# Patient Record
Sex: Male | Born: 1940 | Hispanic: No | Smoking: Never smoker
Health system: Southern US, Community
[De-identification: ages and names within clinical notes are randomized; demographics above are authoritative.]

## PROBLEM LIST (undated history)

## (undated) DIAGNOSIS — F32A Depression, unspecified: Secondary | ICD-10-CM

## (undated) DIAGNOSIS — R7303 Prediabetes: Secondary | ICD-10-CM

## (undated) DIAGNOSIS — F329 Major depressive disorder, single episode, unspecified: Secondary | ICD-10-CM

## (undated) DIAGNOSIS — Z974 Presence of external hearing-aid: Secondary | ICD-10-CM

## (undated) DIAGNOSIS — Z972 Presence of dental prosthetic device (complete) (partial): Secondary | ICD-10-CM

## (undated) DIAGNOSIS — I10 Essential (primary) hypertension: Secondary | ICD-10-CM

## (undated) DIAGNOSIS — E785 Hyperlipidemia, unspecified: Secondary | ICD-10-CM

## (undated) DIAGNOSIS — M1990 Unspecified osteoarthritis, unspecified site: Secondary | ICD-10-CM

## (undated) DIAGNOSIS — F419 Anxiety disorder, unspecified: Secondary | ICD-10-CM

## (undated) DIAGNOSIS — E039 Hypothyroidism, unspecified: Secondary | ICD-10-CM

## (undated) DIAGNOSIS — E079 Disorder of thyroid, unspecified: Secondary | ICD-10-CM

## (undated) DIAGNOSIS — C801 Malignant (primary) neoplasm, unspecified: Secondary | ICD-10-CM

## (undated) DIAGNOSIS — E119 Type 2 diabetes mellitus without complications: Secondary | ICD-10-CM

## (undated) HISTORY — DX: Depression, unspecified: F32.A

## (undated) HISTORY — DX: Hyperlipidemia, unspecified: E78.5

## (undated) HISTORY — PX: KIDNEY SURGERY: SHX687

## (undated) HISTORY — PX: FRACTURE SURGERY: SHX138

## (undated) HISTORY — DX: Type 2 diabetes mellitus without complications: E11.9

## (undated) HISTORY — DX: Essential (primary) hypertension: I10

## (undated) HISTORY — DX: Disorder of thyroid, unspecified: E07.9

## (undated) HISTORY — PX: KNEE SURGERY: SHX244

## (undated) HISTORY — DX: Malignant (primary) neoplasm, unspecified: C80.1

---
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
## 1998-12-24 ENCOUNTER — Emergency Department (HOSPITAL_COMMUNITY): Admission: EM | Admit: 1998-12-24 | Discharge: 1998-12-24 | Payer: Self-pay | Admitting: *Deleted

## 2000-08-15 ENCOUNTER — Emergency Department (HOSPITAL_COMMUNITY): Admission: EM | Admit: 2000-08-15 | Discharge: 2000-08-15 | Payer: Self-pay | Admitting: Emergency Medicine

## 2005-03-27 ENCOUNTER — Emergency Department (HOSPITAL_COMMUNITY): Admission: EM | Admit: 2005-03-27 | Discharge: 2005-03-27 | Payer: Self-pay | Admitting: *Deleted

## 2005-04-03 ENCOUNTER — Emergency Department (HOSPITAL_COMMUNITY): Admission: EM | Admit: 2005-04-03 | Discharge: 2005-04-03 | Payer: Self-pay | Admitting: Emergency Medicine

## 2005-04-07 ENCOUNTER — Emergency Department (HOSPITAL_COMMUNITY): Admission: EM | Admit: 2005-04-07 | Discharge: 2005-04-07 | Payer: Self-pay | Admitting: Emergency Medicine

## 2008-07-03 DIAGNOSIS — C61 Malignant neoplasm of prostate: Secondary | ICD-10-CM

## 2008-07-03 HISTORY — DX: Malignant neoplasm of prostate: C61

## 2008-07-03 HISTORY — PX: PROSTATECTOMY: SHX69

## 2011-06-14 ENCOUNTER — Encounter: Payer: Self-pay | Admitting: Emergency Medicine

## 2011-06-14 ENCOUNTER — Emergency Department (HOSPITAL_COMMUNITY)
Admission: EM | Admit: 2011-06-14 | Discharge: 2011-06-14 | Disposition: A | Discharge status: Home / Self Care | Payer: Medicare Other | Attending: Emergency Medicine | Admitting: Emergency Medicine

## 2011-06-14 DIAGNOSIS — M25562 Pain in left knee: Secondary | ICD-10-CM

## 2011-06-14 DIAGNOSIS — F17200 Nicotine dependence, unspecified, uncomplicated: Secondary | ICD-10-CM | POA: Insufficient documentation

## 2011-06-14 DIAGNOSIS — M25569 Pain in unspecified knee: Secondary | ICD-10-CM | POA: Insufficient documentation

## 2011-06-14 HISTORY — DX: Anxiety disorder, unspecified: F41.9

## 2011-06-14 MED ORDER — HYDROCODONE-ACETAMINOPHEN 5-325 MG PO TABS: 1.0000 | ORAL_TABLET | ORAL | Status: AC | PRN

## 2011-06-14 NOTE — ED Provider Notes (Signed)
Medical screening examination/treatment/procedure(s) were performed by non-physician practitioner and as supervising physician I was immediately available for consultation/collaboration.   Glynn Octave, MD 06/14/11 [phone]

## 2011-06-14 NOTE — ED Notes (Signed)
Left knee pain x several days no new injury recently, has hx of surgery on that knee hurts to walk

## 2011-06-14 NOTE — ED Provider Notes (Signed)
History     CSN: [id number] Arrival date & time: 06/14/2011  8:30 AM   First MD Initiated Contact with Patient 06/14/11 [phone]      Chief Complaint  Patient presents with  . Knee Pain    (Consider location/radiation/quality/duration/timing/severity/associated sxs/prior treatment) HPI History provided by pt.   Pt c/o non-traumatic L knee pain for the past week.  Pain worst when he first wakes in am and is aggravated by bearing weight.  No associated fever, weakness, paresthesias.  H/o surgery on this knee.  No known arthritis.    Past Medical History  Diagnosis Date  . Anxiety     Past Surgical History  Procedure Date  . Fracture surgery     No family history on file.  History  Substance Use Topics  . Smoking status: Current Everyday Smoker  . Smokeless tobacco: Not on file  . Alcohol Use: No      Review of Systems  All other systems reviewed and are negative.    Allergies  Review of patient's allergies indicates no known allergies.  Home Medications   Current Outpatient Rx  Name Route Sig Dispense Refill  . CILOSTAZOL 50 MG PO TABS Oral Take 50 mg by mouth 2 (two) times daily.      Marland Kitchen GABAPENTIN 100 MG PO CAPS Oral Take 100 mg by mouth 2 (two) times daily.      Marland Kitchen MIRTAZAPINE 15 MG PO TABS Oral Take 15 mg by mouth 2 (two) times daily.      Marland Kitchen PRAVASTATIN SODIUM 10 MG PO TABS Oral Take 10 mg by mouth 2 (two) times daily.        BP 136/73  Pulse 63  Temp(Src) 97.3 F (36.3 C) (Oral)  Resp 16  SpO2 99%  Physical Exam  Nursing note and vitals reviewed. Constitutional: He is oriented to person, place, and time. He appears well-developed and well-nourished. No distress.  HENT:  Head: Normocephalic and atraumatic.  Eyes:       Normal appearance  Neck: Normal range of motion.  Musculoskeletal:       Left knee w/out deformity, edema, erythema or other skin changes.  Non-tender.  Mild pain w/ extreme passive flexion only.  Distal NV intact.   Neurological:  He is alert and oriented to person, place, and time.  Psychiatric: He has a normal mood and affect. His behavior is normal.    ED Course  Procedures (including critical care time)  Labs Reviewed - No data to display No results found.   1. Pain in left knee       MDM  Pt presents w/ non-traumatic L knee pain.  No findings to suggest infection on exam.  Has h/o surgery of this joint and I suspect arthritis.  Ortho tech placed in knee sleeve and pt discharged home w/ pain medication and referral to ortho.  Return precautions discussed.         Otilio Miu, PA 06/14/11 1726

## 2014-10-01 ENCOUNTER — Encounter (HOSPITAL_COMMUNITY): Payer: Self-pay | Admitting: Emergency Medicine

## 2014-10-01 ENCOUNTER — Emergency Department (HOSPITAL_COMMUNITY)
Admission: EM | Admit: 2014-10-01 | Discharge: 2014-10-01 | Disposition: A | Discharge status: Home / Self Care | Payer: Medicare Other | Attending: Emergency Medicine | Admitting: Emergency Medicine

## 2014-10-01 DIAGNOSIS — Y9389 Activity, other specified: Secondary | ICD-10-CM | POA: Diagnosis not present

## 2014-10-01 DIAGNOSIS — F419 Anxiety disorder, unspecified: Secondary | ICD-10-CM | POA: Insufficient documentation

## 2014-10-01 DIAGNOSIS — Y998 Other external cause status: Secondary | ICD-10-CM | POA: Diagnosis not present

## 2014-10-01 DIAGNOSIS — S61412A Laceration without foreign body of left hand, initial encounter: Secondary | ICD-10-CM | POA: Insufficient documentation

## 2014-10-01 DIAGNOSIS — W260XXA Contact with knife, initial encounter: Secondary | ICD-10-CM | POA: Insufficient documentation

## 2014-10-01 DIAGNOSIS — Y9289 Other specified places as the place of occurrence of the external cause: Secondary | ICD-10-CM | POA: Diagnosis not present

## 2014-10-01 DIAGNOSIS — Z79899 Other long term (current) drug therapy: Secondary | ICD-10-CM | POA: Diagnosis not present

## 2014-10-01 NOTE — ED Provider Notes (Addendum)
CSN: [id number]     Arrival date & time 10/01/14  1051 History  This chart was scribed for non-physician practitioner, Alvina Chou, PA-C, working with Noemi Chapel, MD, by Chester Holstein, ED Scribe. This patient was seen in room TR05C/TR05C and the patient's care was started at 11:37 AM.    Chief Complaint  Patient presents with  . Extremity Laceration    Patient is a 74 y.o. male presenting with skin laceration. The history is provided by the patient. No language interpreter was used.  Laceration Location:  Hand Hand laceration location:  L hand Length (cm):  3cm Depth:  Cutaneous Quality: straight   Bleeding: controlled   Time since incident:  1 day Laceration mechanism:  Knife Pain details:    Quality:  Unable to specify   Severity:  No pain   Timing:  Rare   Progression:  Resolved Foreign body present:  No foreign bodies Relieved by:  Nothing Worsened by:  Nothing tried Ineffective treatments:  Pressure Tetanus status:  Up to date  HPI Comments: Ricky Young is a 74 y.o. male who presents to the Emergency Department complaining of laceration to left hand with onset yesterday around 11 AM. Pt states he cut his hand with a roofing knife. No active bleeding. Pt denies any other symptoms.   Past Medical History  Diagnosis Date  . Anxiety    Past Surgical History  Procedure Laterality Date  . Fracture surgery     No family history on file. History  Substance Use Topics  . Smoking status: Never Smoker   . Smokeless tobacco: Current User    Types: Chew  . Alcohol Use: No    Review of Systems  Skin: Positive for wound.  All other systems reviewed and are negative.     Allergies  Review of patient's allergies indicates no known allergies.  Home Medications   Prior to Admission medications   Medication Sig Start Date End Date Taking? Authorizing Provider  cilostazol (PLETAL) 50 MG tablet Take 50 mg by mouth 2 (two) times daily.      Historical Provider,  MD  gabapentin (NEURONTIN) 100 MG capsule Take 100 mg by mouth 2 (two) times daily.      Historical Provider, MD  mirtazapine (REMERON) 15 MG tablet Take 15 mg by mouth 2 (two) times daily.      Historical Provider, MD  pravastatin (PRAVACHOL) 10 MG tablet Take 10 mg by mouth 2 (two) times daily.      Historical Provider, MD   BP 121/62 mmHg  Pulse 62  Temp(Src) 98 F (36.7 C) (Oral)  Resp 20  Ht 5\' 6"  (1.676 m)  Wt 140 lb (63.504 kg)  BMI 22.61 kg/m2  SpO2 99% Physical Exam  Constitutional: He is oriented to person, place, and time. He appears well-developed and well-nourished. No distress.  HENT:  Head: Normocephalic and atraumatic.  Eyes: Conjunctivae are normal.  Neck: Normal range of motion. Neck supple.  Cardiovascular: Normal rate and regular rhythm.  Exam reveals no gallop and no friction rub.   No murmur heard. Pulmonary/Chest: Effort normal and breath sounds normal. He has no wheezes. He has no rales. He exhibits no tenderness.  Abdominal: Soft. There is no tenderness.  Musculoskeletal: Normal range of motion.  Neurological: He is alert and oriented to person, place, and time.  Speech is goal-oriented. Moves limbs without ataxia.   Skin: Skin is warm and dry.  3cm laceration of hypothenar aspect of left hand. No bleeding.  Psychiatric: He has a normal mood and affect. His behavior is normal.  Nursing note and vitals reviewed.   ED Course  Procedures (including critical care time) DIAGNOSTIC STUDIES: Oxygen Saturation is 99% on room, normal by my interpretation.    COORDINATION OF CARE: 11:39 AM Discussed treatment plan with patient at beside, the patient agrees with the plan and has no further questions at this time.   LACERATION REPAIR Performed by: Alvina Chou Authorized by: Alvina Chou Consent: Verbal consent obtained. Risks and benefits: risks, benefits and alternatives were discussed Consent given by: patient Patient identity confirmed:  provided demographic data Prepped and Draped in normal sterile fashion Wound explored  Laceration Location: hypothenar aspect of left hand  Laceration Length: 3 cm  No Foreign Bodies seen or palpated  Anesthesia: none  Irrigation method: syringe Amount of cleaning: standard  Skin closure: dermabond  Number of sutures: n/a  Technique: n/a  Patient tolerance: Patient tolerated the procedure well with no immediate complications.   Labs Review Labs Reviewed - No data to display  Imaging Review No results found.   EKG Interpretation None      MDM   Final diagnoses:  Hand laceration, left, initial encounter   12:06 PM Laceration cleaned and repaired without difficulty. Tetanus UTD. No other injuries.   I personally performed the services described in this documentation, which was scribed in my presence. The recorded information has been reviewed and is accurate.     Alvina Chou, PA-C 10/01/14 [place], MD 10/01/14 [place], PA-C 10/11/14 0423  Noemi Chapel, MD 10/11/14 [phone]

## 2014-10-01 NOTE — Discharge Instructions (Signed)
Keep wound area clean. The skin glue will dissolve as the wound heals. Refer to attached documents for more information.

## 2014-10-01 NOTE — ED Notes (Signed)
Patient states he was working outside yesterday around 1100.  He cut his hand with roofing knife.   Patient denies other symptoms.

## 2016-10-05 DIAGNOSIS — M170 Bilateral primary osteoarthritis of knee: Secondary | ICD-10-CM | POA: Diagnosis not present

## 2016-10-05 DIAGNOSIS — M25561 Pain in right knee: Secondary | ICD-10-CM | POA: Diagnosis not present

## 2016-10-05 DIAGNOSIS — M1712 Unilateral primary osteoarthritis, left knee: Secondary | ICD-10-CM | POA: Diagnosis not present

## 2016-10-05 DIAGNOSIS — M25562 Pain in left knee: Secondary | ICD-10-CM | POA: Diagnosis not present

## 2016-10-11 DIAGNOSIS — M1712 Unilateral primary osteoarthritis, left knee: Secondary | ICD-10-CM | POA: Diagnosis not present

## 2016-10-11 DIAGNOSIS — M25562 Pain in left knee: Secondary | ICD-10-CM | POA: Diagnosis not present

## 2016-10-25 DIAGNOSIS — M25562 Pain in left knee: Secondary | ICD-10-CM | POA: Diagnosis not present

## 2016-10-25 DIAGNOSIS — M1712 Unilateral primary osteoarthritis, left knee: Secondary | ICD-10-CM | POA: Diagnosis not present

## 2016-11-02 DIAGNOSIS — M25562 Pain in left knee: Secondary | ICD-10-CM | POA: Diagnosis not present

## 2016-11-02 DIAGNOSIS — M1712 Unilateral primary osteoarthritis, left knee: Secondary | ICD-10-CM | POA: Diagnosis not present

## 2016-11-07 DIAGNOSIS — M25562 Pain in left knee: Secondary | ICD-10-CM | POA: Diagnosis not present

## 2016-11-07 DIAGNOSIS — M1712 Unilateral primary osteoarthritis, left knee: Secondary | ICD-10-CM | POA: Diagnosis not present

## 2016-11-09 DIAGNOSIS — E538 Deficiency of other specified B group vitamins: Secondary | ICD-10-CM | POA: Diagnosis not present

## 2016-11-09 DIAGNOSIS — D649 Anemia, unspecified: Secondary | ICD-10-CM | POA: Diagnosis not present

## 2016-11-09 DIAGNOSIS — N182 Chronic kidney disease, stage 2 (mild): Secondary | ICD-10-CM | POA: Diagnosis not present

## 2016-11-09 DIAGNOSIS — I1310 Hypertensive heart and chronic kidney disease without heart failure, with stage 1 through stage 4 chronic kidney disease, or unspecified chronic kidney disease: Secondary | ICD-10-CM | POA: Diagnosis not present

## 2016-11-09 DIAGNOSIS — R972 Elevated prostate specific antigen [PSA]: Secondary | ICD-10-CM | POA: Diagnosis not present

## 2016-11-09 DIAGNOSIS — E559 Vitamin D deficiency, unspecified: Secondary | ICD-10-CM | POA: Diagnosis not present

## 2016-11-09 DIAGNOSIS — E785 Hyperlipidemia, unspecified: Secondary | ICD-10-CM | POA: Diagnosis not present

## 2016-11-16 DIAGNOSIS — E559 Vitamin D deficiency, unspecified: Secondary | ICD-10-CM | POA: Diagnosis not present

## 2016-11-16 DIAGNOSIS — E785 Hyperlipidemia, unspecified: Secondary | ICD-10-CM | POA: Diagnosis not present

## 2016-11-16 DIAGNOSIS — E538 Deficiency of other specified B group vitamins: Secondary | ICD-10-CM | POA: Diagnosis not present

## 2016-11-16 DIAGNOSIS — R635 Abnormal weight gain: Secondary | ICD-10-CM | POA: Diagnosis not present

## 2016-11-16 DIAGNOSIS — I739 Peripheral vascular disease, unspecified: Secondary | ICD-10-CM | POA: Diagnosis not present

## 2016-12-14 DIAGNOSIS — R635 Abnormal weight gain: Secondary | ICD-10-CM | POA: Diagnosis not present

## 2016-12-14 DIAGNOSIS — E039 Hypothyroidism, unspecified: Secondary | ICD-10-CM | POA: Diagnosis not present

## 2017-02-16 DIAGNOSIS — E039 Hypothyroidism, unspecified: Secondary | ICD-10-CM | POA: Diagnosis not present

## 2017-02-16 DIAGNOSIS — E785 Hyperlipidemia, unspecified: Secondary | ICD-10-CM | POA: Diagnosis not present

## 2017-02-16 DIAGNOSIS — E559 Vitamin D deficiency, unspecified: Secondary | ICD-10-CM | POA: Diagnosis not present

## 2017-02-16 DIAGNOSIS — J449 Chronic obstructive pulmonary disease, unspecified: Secondary | ICD-10-CM | POA: Diagnosis not present

## 2017-02-16 DIAGNOSIS — Z79899 Other long term (current) drug therapy: Secondary | ICD-10-CM | POA: Diagnosis not present

## 2017-02-16 DIAGNOSIS — I1310 Hypertensive heart and chronic kidney disease without heart failure, with stage 1 through stage 4 chronic kidney disease, or unspecified chronic kidney disease: Secondary | ICD-10-CM | POA: Diagnosis not present

## 2017-02-16 DIAGNOSIS — N182 Chronic kidney disease, stage 2 (mild): Secondary | ICD-10-CM | POA: Diagnosis not present

## 2017-02-16 DIAGNOSIS — E538 Deficiency of other specified B group vitamins: Secondary | ICD-10-CM | POA: Diagnosis not present

## 2017-04-16 DIAGNOSIS — E785 Hyperlipidemia, unspecified: Secondary | ICD-10-CM | POA: Diagnosis not present

## 2017-04-16 DIAGNOSIS — N182 Chronic kidney disease, stage 2 (mild): Secondary | ICD-10-CM | POA: Diagnosis not present

## 2017-04-16 DIAGNOSIS — I10 Essential (primary) hypertension: Secondary | ICD-10-CM | POA: Diagnosis not present

## 2017-04-16 DIAGNOSIS — J449 Chronic obstructive pulmonary disease, unspecified: Secondary | ICD-10-CM | POA: Diagnosis not present

## 2017-04-16 DIAGNOSIS — Z23 Encounter for immunization: Secondary | ICD-10-CM | POA: Diagnosis not present

## 2017-06-18 DIAGNOSIS — N182 Chronic kidney disease, stage 2 (mild): Secondary | ICD-10-CM | POA: Diagnosis not present

## 2017-06-18 DIAGNOSIS — D509 Iron deficiency anemia, unspecified: Secondary | ICD-10-CM | POA: Diagnosis not present

## 2017-06-18 DIAGNOSIS — I1310 Hypertensive heart and chronic kidney disease without heart failure, with stage 1 through stage 4 chronic kidney disease, or unspecified chronic kidney disease: Secondary | ICD-10-CM | POA: Diagnosis not present

## 2017-06-18 DIAGNOSIS — E559 Vitamin D deficiency, unspecified: Secondary | ICD-10-CM | POA: Diagnosis not present

## 2017-06-18 DIAGNOSIS — I739 Peripheral vascular disease, unspecified: Secondary | ICD-10-CM | POA: Diagnosis not present

## 2017-06-18 DIAGNOSIS — Z79899 Other long term (current) drug therapy: Secondary | ICD-10-CM | POA: Diagnosis not present

## 2017-06-18 DIAGNOSIS — E785 Hyperlipidemia, unspecified: Secondary | ICD-10-CM | POA: Diagnosis not present

## 2017-06-18 DIAGNOSIS — J449 Chronic obstructive pulmonary disease, unspecified: Secondary | ICD-10-CM | POA: Diagnosis not present

## 2017-07-26 DIAGNOSIS — J069 Acute upper respiratory infection, unspecified: Secondary | ICD-10-CM | POA: Diagnosis not present

## 2017-08-03 DIAGNOSIS — J209 Acute bronchitis, unspecified: Secondary | ICD-10-CM | POA: Diagnosis not present

## 2017-08-15 DIAGNOSIS — J209 Acute bronchitis, unspecified: Secondary | ICD-10-CM | POA: Diagnosis not present

## 2017-10-17 DIAGNOSIS — E559 Vitamin D deficiency, unspecified: Secondary | ICD-10-CM | POA: Diagnosis not present

## 2017-10-17 DIAGNOSIS — I5189 Other ill-defined heart diseases: Secondary | ICD-10-CM | POA: Diagnosis not present

## 2017-10-17 DIAGNOSIS — R7309 Other abnormal glucose: Secondary | ICD-10-CM | POA: Diagnosis not present

## 2017-10-17 DIAGNOSIS — D509 Iron deficiency anemia, unspecified: Secondary | ICD-10-CM | POA: Diagnosis not present

## 2017-10-17 DIAGNOSIS — C61 Malignant neoplasm of prostate: Secondary | ICD-10-CM | POA: Diagnosis not present

## 2017-10-17 DIAGNOSIS — J449 Chronic obstructive pulmonary disease, unspecified: Secondary | ICD-10-CM | POA: Diagnosis not present

## 2017-10-17 DIAGNOSIS — I1310 Hypertensive heart and chronic kidney disease without heart failure, with stage 1 through stage 4 chronic kidney disease, or unspecified chronic kidney disease: Secondary | ICD-10-CM | POA: Diagnosis not present

## 2017-10-17 DIAGNOSIS — E538 Deficiency of other specified B group vitamins: Secondary | ICD-10-CM | POA: Diagnosis not present

## 2017-10-17 DIAGNOSIS — I739 Peripheral vascular disease, unspecified: Secondary | ICD-10-CM | POA: Diagnosis not present

## 2017-10-17 DIAGNOSIS — E039 Hypothyroidism, unspecified: Secondary | ICD-10-CM | POA: Diagnosis not present

## 2017-10-17 DIAGNOSIS — N182 Chronic kidney disease, stage 2 (mild): Secondary | ICD-10-CM | POA: Diagnosis not present

## 2018-04-26 DIAGNOSIS — D509 Iron deficiency anemia, unspecified: Secondary | ICD-10-CM | POA: Diagnosis not present

## 2018-04-26 DIAGNOSIS — E538 Deficiency of other specified B group vitamins: Secondary | ICD-10-CM | POA: Diagnosis not present

## 2018-04-26 DIAGNOSIS — R7309 Other abnormal glucose: Secondary | ICD-10-CM | POA: Diagnosis not present

## 2018-04-26 DIAGNOSIS — E559 Vitamin D deficiency, unspecified: Secondary | ICD-10-CM | POA: Diagnosis not present

## 2018-04-26 DIAGNOSIS — Z79899 Other long term (current) drug therapy: Secondary | ICD-10-CM | POA: Diagnosis not present

## 2018-04-26 DIAGNOSIS — I10 Essential (primary) hypertension: Secondary | ICD-10-CM | POA: Diagnosis not present

## 2018-04-26 DIAGNOSIS — Z6822 Body mass index (BMI) 22.0-22.9, adult: Secondary | ICD-10-CM | POA: Diagnosis not present

## 2018-04-26 DIAGNOSIS — E039 Hypothyroidism, unspecified: Secondary | ICD-10-CM | POA: Diagnosis not present

## 2018-04-26 DIAGNOSIS — I739 Peripheral vascular disease, unspecified: Secondary | ICD-10-CM | POA: Diagnosis not present

## 2018-04-26 DIAGNOSIS — Z23 Encounter for immunization: Secondary | ICD-10-CM | POA: Diagnosis not present

## 2018-04-26 DIAGNOSIS — N182 Chronic kidney disease, stage 2 (mild): Secondary | ICD-10-CM | POA: Diagnosis not present

## 2018-05-07 DIAGNOSIS — Z720 Tobacco use: Secondary | ICD-10-CM | POA: Diagnosis not present

## 2018-05-07 DIAGNOSIS — Z1329 Encounter for screening for other suspected endocrine disorder: Secondary | ICD-10-CM | POA: Diagnosis not present

## 2018-05-07 DIAGNOSIS — I739 Peripheral vascular disease, unspecified: Secondary | ICD-10-CM | POA: Diagnosis not present

## 2018-05-07 DIAGNOSIS — Z125 Encounter for screening for malignant neoplasm of prostate: Secondary | ICD-10-CM | POA: Diagnosis not present

## 2018-05-07 DIAGNOSIS — G629 Polyneuropathy, unspecified: Secondary | ICD-10-CM | POA: Diagnosis not present

## 2018-05-07 DIAGNOSIS — E7800 Pure hypercholesterolemia, unspecified: Secondary | ICD-10-CM | POA: Diagnosis not present

## 2018-05-07 DIAGNOSIS — Z131 Encounter for screening for diabetes mellitus: Secondary | ICD-10-CM | POA: Diagnosis not present

## 2018-05-07 DIAGNOSIS — Z01118 Encounter for examination of ears and hearing with other abnormal findings: Secondary | ICD-10-CM | POA: Diagnosis not present

## 2018-05-07 DIAGNOSIS — F418 Other specified anxiety disorders: Secondary | ICD-10-CM | POA: Diagnosis not present

## 2018-05-07 DIAGNOSIS — Z136 Encounter for screening for cardiovascular disorders: Secondary | ICD-10-CM | POA: Diagnosis not present

## 2018-05-07 DIAGNOSIS — H538 Other visual disturbances: Secondary | ICD-10-CM | POA: Diagnosis not present

## 2018-05-28 DIAGNOSIS — E7800 Pure hypercholesterolemia, unspecified: Secondary | ICD-10-CM | POA: Diagnosis not present

## 2018-05-28 DIAGNOSIS — G629 Polyneuropathy, unspecified: Secondary | ICD-10-CM | POA: Diagnosis not present

## 2018-05-28 DIAGNOSIS — F418 Other specified anxiety disorders: Secondary | ICD-10-CM | POA: Diagnosis not present

## 2018-05-28 DIAGNOSIS — I739 Peripheral vascular disease, unspecified: Secondary | ICD-10-CM | POA: Diagnosis not present

## 2018-05-28 DIAGNOSIS — E039 Hypothyroidism, unspecified: Secondary | ICD-10-CM | POA: Diagnosis not present

## 2018-05-28 DIAGNOSIS — Z720 Tobacco use: Secondary | ICD-10-CM | POA: Diagnosis not present

## 2018-07-16 DIAGNOSIS — E039 Hypothyroidism, unspecified: Secondary | ICD-10-CM | POA: Diagnosis not present

## 2018-07-16 DIAGNOSIS — Z720 Tobacco use: Secondary | ICD-10-CM | POA: Diagnosis not present

## 2018-07-16 DIAGNOSIS — Z0001 Encounter for general adult medical examination with abnormal findings: Secondary | ICD-10-CM | POA: Diagnosis not present

## 2018-07-16 DIAGNOSIS — I739 Peripheral vascular disease, unspecified: Secondary | ICD-10-CM | POA: Diagnosis not present

## 2018-07-16 DIAGNOSIS — F418 Other specified anxiety disorders: Secondary | ICD-10-CM | POA: Diagnosis not present

## 2018-07-16 DIAGNOSIS — G629 Polyneuropathy, unspecified: Secondary | ICD-10-CM | POA: Diagnosis not present

## 2018-07-16 DIAGNOSIS — E7800 Pure hypercholesterolemia, unspecified: Secondary | ICD-10-CM | POA: Diagnosis not present

## 2018-07-16 DIAGNOSIS — R7303 Prediabetes: Secondary | ICD-10-CM | POA: Diagnosis not present

## 2018-07-26 DIAGNOSIS — H2513 Age-related nuclear cataract, bilateral: Secondary | ICD-10-CM | POA: Diagnosis not present

## 2018-07-26 DIAGNOSIS — H02831 Dermatochalasis of right upper eyelid: Secondary | ICD-10-CM | POA: Diagnosis not present

## 2018-07-26 DIAGNOSIS — H04123 Dry eye syndrome of bilateral lacrimal glands: Secondary | ICD-10-CM | POA: Diagnosis not present

## 2018-07-26 DIAGNOSIS — H02834 Dermatochalasis of left upper eyelid: Secondary | ICD-10-CM | POA: Diagnosis not present

## 2018-08-23 DIAGNOSIS — I259 Chronic ischemic heart disease, unspecified: Secondary | ICD-10-CM | POA: Diagnosis not present

## 2018-08-23 DIAGNOSIS — R9431 Abnormal electrocardiogram [ECG] [EKG]: Secondary | ICD-10-CM | POA: Diagnosis not present

## 2018-10-15 DIAGNOSIS — E039 Hypothyroidism, unspecified: Secondary | ICD-10-CM | POA: Diagnosis not present

## 2018-10-15 DIAGNOSIS — E7800 Pure hypercholesterolemia, unspecified: Secondary | ICD-10-CM | POA: Diagnosis not present

## 2018-10-15 DIAGNOSIS — R7303 Prediabetes: Secondary | ICD-10-CM | POA: Diagnosis not present

## 2018-10-15 DIAGNOSIS — G629 Polyneuropathy, unspecified: Secondary | ICD-10-CM | POA: Diagnosis not present

## 2018-12-13 DIAGNOSIS — R0600 Dyspnea, unspecified: Secondary | ICD-10-CM | POA: Diagnosis not present

## 2018-12-26 DIAGNOSIS — R7303 Prediabetes: Secondary | ICD-10-CM | POA: Diagnosis not present

## 2018-12-26 DIAGNOSIS — E7800 Pure hypercholesterolemia, unspecified: Secondary | ICD-10-CM | POA: Diagnosis not present

## 2018-12-26 DIAGNOSIS — Z0001 Encounter for general adult medical examination with abnormal findings: Secondary | ICD-10-CM | POA: Diagnosis not present

## 2018-12-26 DIAGNOSIS — Z720 Tobacco use: Secondary | ICD-10-CM | POA: Diagnosis not present

## 2018-12-26 DIAGNOSIS — E039 Hypothyroidism, unspecified: Secondary | ICD-10-CM | POA: Diagnosis not present

## 2019-01-06 ENCOUNTER — Other Ambulatory Visit: Payer: Self-pay

## 2019-01-06 ENCOUNTER — Encounter: Payer: Self-pay | Admitting: Cardiology

## 2019-01-06 ENCOUNTER — Ambulatory Visit (INDEPENDENT_AMBULATORY_CARE_PROVIDER_SITE_OTHER): Payer: Medicare Other | Admitting: Cardiology

## 2019-01-06 VITALS — BP 142/76 | HR 62 | Ht 64.0 in | Wt 142.5 lb

## 2019-01-06 DIAGNOSIS — I10 Essential (primary) hypertension: Secondary | ICD-10-CM | POA: Diagnosis not present

## 2019-01-06 NOTE — Progress Notes (Signed)
Patient referred by Benito Mccreedy, MD for DOE  Subjective:   Ricky Young, male    DOB: 1940-08-24, 78 y.o.   MRN: [id number]   Chief Complaint  Patient presents with  . New Patient (Initial Visit)    DOE    HPI  78 y.o. AA male with PMH significant for HTN, HLD, T2DM, MDD, leg claudication, Hypothyroidism.  He was referred for evaluation of dyspnea on exertion.    Patient lives by himself and performs all his ADL's without any difficulty. He walks to [employer] twice daily which is around 7 blocks away and stays very active around the house.  Additionally he denies any chest pain, tightness, pressure or any discomfort with exertion or at rest.  He brought his medications today, he says he takes them faithfully but admits he is unsure what the indications for them are.     Past Medical History:  Diagnosis Date  . Cancer ([place])   . Depression   . Diabetes mellitus without complication ([place])   . Hyperlipidemia   . Hypertension   . Thyroid disease    hypothyrodism    Past Surgical History:  Procedure Laterality Date  . KIDNEY SURGERY    . KNEE SURGERY      Social History   Socioeconomic History  . Marital status: Legally Separated    Spouse name: Not on file  . Number of children: 2  . Years of education: Not on file  . Highest education level: Not on file  Occupational History  . Not on file  Social Needs  . Financial resource strain: Not on file  . Food insecurity    Worry: Not on file    Inability: Not on file  . Transportation needs    Medical: Not on file    Non-medical: Not on file  Tobacco Use  . Smoking status: Never Smoker  . Smokeless tobacco: Current User    Types: Chew  Substance and Sexual Activity  . Alcohol use: Not Currently    Comment: quit 2016  . Drug use: Not Currently  . Sexual activity: Not on file  Lifestyle  . Physical activity    Days per week: Not on file    Minutes per session: Not on file  . Stress: Not on file   Relationships  . Social Herbalist on phone: Not on file    Gets together: Not on file    Attends religious service: Not on file    Active member of club or organization: Not on file    Attends meetings of clubs or organizations: Not on file    Relationship status: Not on file  . Intimate partner violence    Fear of current or ex partner: Not on file    Emotionally abused: Not on file    Physically abused: Not on file    Forced sexual activity: Not on file  Other Topics Concern  . Not on file  Social History Narrative  . Not on file   History reviewed. No pertinent family history.  Current Outpatient Medications on File Prior to Visit  Medication Sig Dispense Refill  . cilostazol (PLETAL) 50 MG tablet Take 1 tablet by mouth 2 (two) times a day.    . gabapentin (NEURONTIN) 100 MG capsule Take 1 capsule by mouth 3 (three) times daily.    Marland Kitchen levothyroxine (SYNTHROID) 25 MCG tablet Take 1 tablet by mouth daily.    . mirtazapine (REMERON) 15  MG tablet Take 1 tablet by mouth daily.    . pravastatin (PRAVACHOL) 10 MG tablet Take 1 tablet by mouth daily.     No current facility-administered medications on file prior to visit.     Cardiovascular studies:  EKG 01/06/2019: Sinus rhythm 62 bpm. 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 Possible old anteroseptal infarct.   Outside echocardiogram 12/2018: Limited report Normal EF, grade 1 DD   Recent labs:  10/2018 Hgb A1C 6.0 Lipid Panel: LDL 57, HDL 51, Total 125, TG 84 CMP: Cr 1.18, BUN 9, AST 42, ALT 38, eGFR 58 other values WNL CBC: WBC 5.9, Hgb 13.6, MCV 86, other values WNL    Review of Systems  Constitution: Negative for decreased appetite, malaise/fatigue, weight gain and weight loss.  HENT: Negative for congestion.   Eyes: Negative for visual disturbance.  Cardiovascular: Negative for chest pain, dyspnea on exertion, leg swelling, palpitations and syncope.  Respiratory: Negative for cough.   Endocrine:  Negative for cold intolerance.  Hematologic/Lymphatic: Does not bruise/bleed easily.  Skin: Negative for itching and rash.  Musculoskeletal: Negative for myalgias.  Gastrointestinal: Negative for abdominal pain, nausea and vomiting.  Genitourinary: Negative for dysuria.  Neurological: Negative for dizziness and weakness.  Psychiatric/Behavioral: The patient is not nervous/anxious.   All other systems reviewed and are negative.         Vitals:   01/06/19 1404  BP: (!) 142/76  Pulse: 62  SpO2: 95%     Body mass index is 24.46 kg/m. Filed Weights   01/06/19 1404  Weight: 142 lb 8 oz (64.6 kg)     Objective:   Physical Exam  Constitutional: He is oriented to person, place, and time. He appears well-developed and well-nourished. No distress.  HENT:  Head: Normocephalic and atraumatic.  Eyes: Pupils are equal, round, and reactive to light. Conjunctivae are normal.  Neck: No JVD present.  Cardiovascular: Normal rate, regular rhythm and intact distal pulses.  Pulmonary/Chest: Effort normal and breath sounds normal. He has no wheezes. He has no rales.  Abdominal: Soft. Bowel sounds are normal. There is no rebound.  Musculoskeletal:        General: No edema.  Lymphadenopathy:    He has no cervical adenopathy.  Neurological: He is alert and oriented to person, place, and time. No cranial nerve deficit.  Skin: Skin is warm and dry.  Psychiatric: He has a normal mood and affect.  Nursing note and vitals reviewed.         Assessment & Recommendations:   78 y.o. AA male with PMH significant for HTN, HLD, T2DM, MDD, leg claudication, Hypothyroidism.  He was referred for evaluation of dyspnea on exertion.    Patient denies any complaints of shortness of breath. His BP is fairly well controled on current medical therapy. I have not made any changes. Limited echocardiogram report from PCP office mentions normal EF and grade 1 diastolic dysfunction. Given lack of any symptoms at  this time, I do not recommend any cardiac testing at this time. Should he have new symptoms develop, I will be happy to see him again.   Thank you for referring the patient to [place]. Please feel free to contact with any questions.  Nigel Mormon, MD [facility] Pager: [phone] Office: [phone] If no answer Cell [phone]

## 2019-01-09 ENCOUNTER — Encounter (HOSPITAL_COMMUNITY): Payer: Self-pay | Admitting: Emergency Medicine

## 2019-01-23 DIAGNOSIS — E7800 Pure hypercholesterolemia, unspecified: Secondary | ICD-10-CM | POA: Diagnosis not present

## 2019-01-23 DIAGNOSIS — R7303 Prediabetes: Secondary | ICD-10-CM | POA: Diagnosis not present

## 2019-01-23 DIAGNOSIS — G629 Polyneuropathy, unspecified: Secondary | ICD-10-CM | POA: Diagnosis not present

## 2019-01-23 DIAGNOSIS — E039 Hypothyroidism, unspecified: Secondary | ICD-10-CM | POA: Diagnosis not present

## 2019-03-11 DIAGNOSIS — G629 Polyneuropathy, unspecified: Secondary | ICD-10-CM | POA: Diagnosis not present

## 2019-03-11 DIAGNOSIS — E039 Hypothyroidism, unspecified: Secondary | ICD-10-CM | POA: Diagnosis not present

## 2019-03-11 DIAGNOSIS — E7800 Pure hypercholesterolemia, unspecified: Secondary | ICD-10-CM | POA: Diagnosis not present

## 2019-03-11 DIAGNOSIS — R7303 Prediabetes: Secondary | ICD-10-CM | POA: Diagnosis not present

## 2019-04-23 DIAGNOSIS — H905 Unspecified sensorineural hearing loss: Secondary | ICD-10-CM | POA: Diagnosis not present

## 2019-05-14 DIAGNOSIS — E039 Hypothyroidism, unspecified: Secondary | ICD-10-CM | POA: Diagnosis not present

## 2019-05-14 DIAGNOSIS — Z23 Encounter for immunization: Secondary | ICD-10-CM | POA: Diagnosis not present

## 2019-05-14 DIAGNOSIS — E785 Hyperlipidemia, unspecified: Secondary | ICD-10-CM | POA: Diagnosis present

## 2019-06-23 DIAGNOSIS — E039 Hypothyroidism, unspecified: Secondary | ICD-10-CM | POA: Diagnosis not present

## 2019-06-23 DIAGNOSIS — R7303 Prediabetes: Secondary | ICD-10-CM | POA: Diagnosis not present

## 2019-06-23 DIAGNOSIS — Z0000 Encounter for general adult medical examination without abnormal findings: Secondary | ICD-10-CM | POA: Diagnosis not present

## 2019-06-23 DIAGNOSIS — G629 Polyneuropathy, unspecified: Secondary | ICD-10-CM

## 2019-06-23 DIAGNOSIS — F320 Major depressive disorder, single episode, mild: Secondary | ICD-10-CM | POA: Diagnosis present

## 2019-06-23 DIAGNOSIS — Z8546 Personal history of malignant neoplasm of prostate: Secondary | ICD-10-CM

## 2019-06-23 DIAGNOSIS — Z23 Encounter for immunization: Secondary | ICD-10-CM | POA: Diagnosis not present

## 2019-06-23 DIAGNOSIS — Z8679 Personal history of other diseases of the circulatory system: Secondary | ICD-10-CM | POA: Diagnosis not present

## 2019-06-23 DIAGNOSIS — E785 Hyperlipidemia, unspecified: Secondary | ICD-10-CM | POA: Diagnosis not present

## 2019-08-27 DIAGNOSIS — G629 Polyneuropathy, unspecified: Secondary | ICD-10-CM | POA: Diagnosis not present

## 2019-08-27 DIAGNOSIS — E039 Hypothyroidism, unspecified: Secondary | ICD-10-CM | POA: Diagnosis not present

## 2019-08-27 DIAGNOSIS — E785 Hyperlipidemia, unspecified: Secondary | ICD-10-CM | POA: Diagnosis not present

## 2019-11-04 ENCOUNTER — Ambulatory Visit: Payer: Medicare Other | Attending: Internal Medicine

## 2019-11-04 DIAGNOSIS — Z23 Encounter for immunization: Secondary | ICD-10-CM

## 2019-11-04 NOTE — Progress Notes (Signed)
   Covid-19 Vaccination Clinic  Name:  MARX GERSHON    MRN: [id number] DOB: 1940-09-17  11/04/2019  Mr. Macer was observed post Covid-19 immunization for 15 minutes without incident. He was provided with Vaccine Information Sheet and instruction to access the V-Safe system.   Mr. Muse was instructed to call 911 with any severe reactions post vaccine: Marland Kitchen Difficulty breathing  . Swelling of face and throat  . A fast heartbeat  . A bad rash all over body  . Dizziness and weakness   Immunizations Administered    Name Date Dose VIS Date Route   Pfizer COVID-19 Vaccine 11/04/2019 11:31 AM 0.3 mL 08/27/2018 Intramuscular   Manufacturer: [place]   Lot: [id number]   [place]: [id number]

## 2019-12-02 ENCOUNTER — Ambulatory Visit: Payer: Medicare Other | Attending: Internal Medicine

## 2019-12-02 DIAGNOSIS — Z23 Encounter for immunization: Secondary | ICD-10-CM

## 2019-12-02 NOTE — Progress Notes (Signed)
   Covid-19 Vaccination Clinic  Name:  Ricky Young    MRN: [id number] DOB: 16-May-1941  12/02/2019  [name] was observed post Covid-19 immunization for 15 minutes without incident. He was provided with Vaccine Information Sheet and instruction to access the V-Safe system.   [name] was instructed to call 911 with any severe reactions post vaccine: Marland Kitchen Difficulty breathing  . Swelling of face and throat  . A fast heartbeat  . A bad rash all over body  . Dizziness and weakness   Immunizations Administered    Name Date Dose VIS Date Route   Pfizer COVID-19 Vaccine 12/02/2019  8:13 AM 0.3 mL 08/27/2018 Intramuscular   Manufacturer: [employer]   Lot: [id number]   [place]: [id number]

## 2021-02-15 ENCOUNTER — Other Ambulatory Visit: Payer: Self-pay

## 2021-02-15 ENCOUNTER — Other Ambulatory Visit: Payer: Self-pay | Admitting: Student

## 2021-02-15 ENCOUNTER — Ambulatory Visit
Admission: RE | Admit: 2021-02-15 | Discharge: 2021-02-15 | Disposition: A | Discharge status: Home / Self Care | Payer: Medicare Other | Source: Ambulatory Visit | Attending: Student | Admitting: Student

## 2021-02-15 DIAGNOSIS — S93602A Unspecified sprain of left foot, initial encounter: Secondary | ICD-10-CM

## 2021-03-02 ENCOUNTER — Encounter: Payer: Self-pay | Admitting: *Deleted

## 2021-03-25 ENCOUNTER — Other Ambulatory Visit: Payer: Self-pay | Admitting: *Deleted

## 2021-03-25 DIAGNOSIS — M25569 Pain in unspecified knee: Secondary | ICD-10-CM

## 2021-03-30 ENCOUNTER — Other Ambulatory Visit: Payer: Self-pay

## 2021-03-30 ENCOUNTER — Ambulatory Visit (INDEPENDENT_AMBULATORY_CARE_PROVIDER_SITE_OTHER): Payer: Medicare Other | Admitting: Vascular Surgery

## 2021-03-30 ENCOUNTER — Encounter: Payer: Self-pay | Admitting: Vascular Surgery

## 2021-03-30 ENCOUNTER — Ambulatory Visit (HOSPITAL_COMMUNITY)
Admission: RE | Admit: 2021-03-30 | Discharge: 2021-03-30 | Disposition: A | Discharge status: Home / Self Care | Payer: Medicare Other | Source: Ambulatory Visit | Attending: Vascular Surgery | Admitting: Vascular Surgery

## 2021-03-30 VITALS — BP 176/74 | HR 59 | Temp 97.9°F | Resp 20 | Ht 64.0 in | Wt 124.0 lb

## 2021-03-30 DIAGNOSIS — I739 Peripheral vascular disease, unspecified: Secondary | ICD-10-CM | POA: Diagnosis not present

## 2021-03-30 DIAGNOSIS — M25569 Pain in unspecified knee: Secondary | ICD-10-CM | POA: Insufficient documentation

## 2021-03-30 NOTE — Progress Notes (Signed)
Patient ID: Ricky Young, male   DOB: 1941/04/14, 80 y.o.   MRN: [id number]  Reason for Consult: New Patient (Initial Visit)   Referred by Benito Mccreedy, MD  Subjective:     HPI:  Ricky Young is a 80 y.o. male without significant vascular history.  He does have history of diabetes, hyperlipidemia, hypertension and has been a lifelong chewing tobacco user.  He has never been a smoker.  He states that he had left foot pain for many years is worse on the top of the foot and at night.  He does not have any tissue loss or ulceration.  He walks without limitation.He does have history of knee surgery on the left.  No history of DVT.  No history of stroke, TIA or amaurosis.  No previous history of coronary artery disease.  No personal or family history of aneurysm disease.    Past Medical History:  Diagnosis Date   Anxiety    Cancer ([place])    Depression    Diabetes mellitus without complication ([place])    Hyperlipidemia    Hypertension    Thyroid disease    hypothyrodism   History reviewed. No pertinent family history. Past Surgical History:  Procedure Laterality Date   FRACTURE SURGERY     KIDNEY SURGERY     KNEE SURGERY      Short Social History:  Social History   Tobacco Use   Smoking status: Never   Smokeless tobacco: Current    Types: Chew  Substance Use Topics   Alcohol use: Not Currently    Comment: quit 2016    No Known Allergies  Current Outpatient Medications  Medication Sig Dispense Refill   cilostazol (PLETAL) 50 MG tablet Take 50 mg by mouth 2 (two) times daily.       gabapentin (NEURONTIN) 100 MG capsule Take 1 capsule by mouth 3 (three) times daily.     levothyroxine (SYNTHROID) 25 MCG tablet Take 1 tablet by mouth daily.     mirtazapine (REMERON) 15 MG tablet Take 1 tablet by mouth daily.     pravastatin (PRAVACHOL) 10 MG tablet Take 10 mg by mouth 2 (two) times daily.       MYRBETRIQ 50 MG TB24 tablet Take 50 mg by mouth daily.     No current  facility-administered medications for this visit.    Review of Systems  Constitutional:  Constitutional negative. HENT: HENT negative.  Eyes: Eyes negative.  Respiratory: Respiratory negative.  Cardiovascular: Cardiovascular negative.  GI: Gastrointestinal negative.  Musculoskeletal:       Left foot pain Skin: Skin negative.  Neurological: Neurological negative. Hematologic: Hematologic/lymphatic negative.  Psychiatric: Psychiatric negative.       Objective:  Objective   Vitals:   03/30/21 1346  BP: (!) 176/74  Pulse: (!) 59  Resp: 20  Temp: 97.9 F (36.6 C)  SpO2: 95%  Weight: 124 lb (56.2 kg)  Height: 5\' 4"  (1.626 m)   Body mass index is 21.28 kg/m.  Physical Exam HENT:     Head: Normocephalic.     Nose:     Comments: Wearing a mask Eyes:     Pupils: Pupils are equal, round, and reactive to light.  Cardiovascular:     Rate and Rhythm: Normal rate.     Pulses: Normal pulses.  Pulmonary:     Effort: Pulmonary effort is normal.  Abdominal:     General: Abdomen is flat.     Palpations: Abdomen is soft. There  is no mass.  Musculoskeletal:        General: Normal range of motion.     Cervical back: Normal range of motion and neck supple.     Right lower leg: No edema.     Left lower leg: No edema.  Skin:    General: Skin is warm and dry.     Capillary Refill: Capillary refill takes less than 2 seconds.  Neurological:     General: No focal deficit present.     Mental Status: He is alert.  Psychiatric:        Mood and Affect: Mood normal.        Behavior: Behavior normal.    Data: ABI Findings:  +---------+------------------+-----+---------+--------+  Right    Rt Pressure (mmHg)IndexWaveform Comment   +---------+------------------+-----+---------+--------+  Brachial 171                                       +---------+------------------+-----+---------+--------+  PTA      >230              [place]   triphasic           +---------+------------------+-----+---------+--------+  DP       >230              [place]   triphasic          +---------+------------------+-----+---------+--------+  [place]135               0.77                    +---------+------------------+-----+---------+--------+   +---------+------------------+-----+---------+-------+  Left     Lt Pressure (mmHg)IndexWaveform Comment  +---------+------------------+-----+---------+-------+  Brachial 175                                      +---------+------------------+-----+---------+-------+  PTA      >230              [place]   triphasic         +---------+------------------+-----+---------+-------+  DP       >230              [place]   triphasic         +---------+------------------+-----+---------+-------+  [place]89                0.51                   +---------+------------------+-----+---------+-------+   +-------+-----------+-----------+------------+------------+  ABI/TBIToday's ABIToday's TBIPrevious ABIPrevious TBI  +-------+-----------+-----------+------------+------------+  Right  [place]         0.77                                 +-------+-----------+-----------+------------+------------+  Left            0.51                                 +-------+-----------+-----------+------------+------------+      Assessment/Plan:     80 year old male here with left foot pain.  Appears to have normal arterial vasculature with palpable pulses distally no evidence of venous disease.  No history of stroke or aneurysm disease.  He can see  me on an as-needed basis     Waynetta Sandy MD [facility]

## 2021-05-04 ENCOUNTER — Other Ambulatory Visit (HOSPITAL_COMMUNITY): Payer: Self-pay | Admitting: Student

## 2021-05-04 DIAGNOSIS — S93602A Unspecified sprain of left foot, initial encounter: Secondary | ICD-10-CM

## 2021-05-30 ENCOUNTER — Other Ambulatory Visit (HOSPITAL_COMMUNITY): Payer: Self-pay | Admitting: Student

## 2021-05-30 ENCOUNTER — Ambulatory Visit
Admission: RE | Admit: 2021-05-30 | Discharge: 2021-05-30 | Disposition: A | Discharge status: Home / Self Care | Payer: Medicare Other | Source: Ambulatory Visit | Attending: Student | Admitting: Student

## 2021-05-30 DIAGNOSIS — S93602A Unspecified sprain of left foot, initial encounter: Secondary | ICD-10-CM

## 2021-12-12 ENCOUNTER — Other Ambulatory Visit: Payer: Self-pay | Admitting: Urology

## 2021-12-15 ENCOUNTER — Other Ambulatory Visit: Payer: Self-pay | Admitting: Urology

## 2021-12-21 ENCOUNTER — Encounter (HOSPITAL_BASED_OUTPATIENT_CLINIC_OR_DEPARTMENT_OTHER): Payer: Self-pay | Admitting: Urology

## 2021-12-21 ENCOUNTER — Other Ambulatory Visit: Payer: Self-pay

## 2021-12-21 NOTE — Progress Notes (Addendum)
Spoke w/ via phone for pre-op interview---pt daughter Ricky Young due to pt hoh Lab needs dos----     bmp          Lab results------elecytolyte panel 12-14-2021 dr Matilde Sprang on chart, bmp 6 03-2022 dr Matilde Sprang on chart COVID test -----patient states asymptomatic no test needed Arrive at -------630 am 12-27-2021 NPO after MN NO Solid Food.  Clear liquids from MN until---530 am Med rec completed Medications to take morning of surgery -----none Diabetic medication -----n/a Patient instructed no nail polish to be worn day of surgery Patient instructed to bring photo id and insurance card day of surgery Patient aware to have Driver (ride ) / caregiver   sister Ricky Young  for 24 hours after surgery  Patient Special Instructions -----no chewing tobacco 24 hours before surgery Pre-Op special Istructions -----none Patient verbalized understanding of instructions that were given at this phone interview. Patient denies shortness of breath, chest pain, fever, cough at this phone interview.   Pt hoh, pt signs own consents per pt daughter Ricky Young

## 2021-12-26 NOTE — Anesthesia Preprocedure Evaluation (Addendum)
Anesthesia Evaluation  Patient identified by MRN, date of birth, ID band Patient awake    Reviewed: Allergy & Precautions, NPO status , Patient's Chart, lab work & pertinent test results  History of Anesthesia Complications Negative for: history of anesthetic complications  Airway Mallampati: II  TM Distance: >3 FB Neck ROM: Full    Dental  (+) Lower Dentures, Upper Dentures   Pulmonary neg pulmonary ROS,    Pulmonary exam normal        Cardiovascular negative cardio ROS Normal cardiovascular exam     Neuro/Psych Anxiety Depression negative neurological ROS     GI/Hepatic negative GI ROS, Neg liver ROS,   Endo/Other  Hypothyroidism   Renal/GU negative Renal ROS   Urethral stricture    Musculoskeletal  (+) Arthritis ,   Abdominal   Peds  Hematology negative hematology ROS (+)   Anesthesia Other Findings Day of surgery medications reviewed with patient.  Reproductive/Obstetrics negative OB ROS                            Anesthesia Physical Anesthesia Plan  ASA: 2  Anesthesia Plan: General   Post-op Pain Management: Tylenol PO (pre-op)*   Induction:   PONV Risk Score and Plan: 2 and Ondansetron, Dexamethasone and Treatment may vary due to age or medical condition  Airway Management Planned: LMA  Additional Equipment: None  Intra-op Plan:   Post-operative Plan: Extubation in OR  Informed Consent: I have reviewed the patients History and Physical, chart, labs and discussed the procedure including the risks, benefits and alternatives for the proposed anesthesia with the patient or authorized representative who has indicated his/her understanding and acceptance.     Dental advisory given  Plan Discussed with: CRNA  Anesthesia Plan Comments:        Anesthesia Quick Evaluation

## 2021-12-27 ENCOUNTER — Ambulatory Visit (HOSPITAL_BASED_OUTPATIENT_CLINIC_OR_DEPARTMENT_OTHER): Payer: Medicare Other | Admitting: Anesthesiology

## 2021-12-27 ENCOUNTER — Encounter (HOSPITAL_BASED_OUTPATIENT_CLINIC_OR_DEPARTMENT_OTHER): Payer: Self-pay | Admitting: Urology

## 2021-12-27 ENCOUNTER — Encounter (HOSPITAL_BASED_OUTPATIENT_CLINIC_OR_DEPARTMENT_OTHER)
Admission: RE | Disposition: A | Discharge status: Home / Self Care | Payer: Self-pay | Source: Ambulatory Visit | Attending: Urology

## 2021-12-27 ENCOUNTER — Ambulatory Visit (HOSPITAL_BASED_OUTPATIENT_CLINIC_OR_DEPARTMENT_OTHER)
Admission: RE | Admit: 2021-12-27 | Discharge: 2021-12-27 | Disposition: A | Discharge status: Home / Self Care | Payer: Medicare Other | Source: Ambulatory Visit | Attending: Urology | Admitting: Urology

## 2021-12-27 DIAGNOSIS — N35919 Unspecified urethral stricture, male, unspecified site: Secondary | ICD-10-CM | POA: Diagnosis not present

## 2021-12-27 DIAGNOSIS — N1330 Unspecified hydronephrosis: Secondary | ICD-10-CM | POA: Insufficient documentation

## 2021-12-27 DIAGNOSIS — N320 Bladder-neck obstruction: Secondary | ICD-10-CM | POA: Diagnosis present

## 2021-12-27 DIAGNOSIS — Z01818 Encounter for other preprocedural examination: Secondary | ICD-10-CM

## 2021-12-27 DIAGNOSIS — N3289 Other specified disorders of bladder: Secondary | ICD-10-CM | POA: Insufficient documentation

## 2021-12-27 DIAGNOSIS — N3946 Mixed incontinence: Secondary | ICD-10-CM | POA: Insufficient documentation

## 2021-12-27 DIAGNOSIS — Z8546 Personal history of malignant neoplasm of prostate: Secondary | ICD-10-CM | POA: Insufficient documentation

## 2021-12-27 HISTORY — DX: Hypothyroidism, unspecified: E03.9

## 2021-12-27 HISTORY — PX: CYSTOSCOPY WITH RETROGRADE URETHROGRAM: SHX6309

## 2021-12-27 HISTORY — DX: Prediabetes: R73.03

## 2021-12-27 HISTORY — DX: Presence of dental prosthetic device (complete) (partial): Z97.2

## 2021-12-27 HISTORY — DX: Unspecified osteoarthritis, unspecified site: M19.90

## 2021-12-27 HISTORY — DX: Presence of external hearing-aid: Z97.4

## 2021-12-27 LAB — BASIC METABOLIC PANEL
Anion gap: 9 (ref 5–15)
BUN: 10 mg/dL (ref 8–23)
CO2: 22 mmol/L (ref 22–32)
Calcium: 8.9 mg/dL (ref 8.9–10.3)
Chloride: 103 mmol/L (ref 98–111)
Creatinine, Ser: 1.8 mg/dL — ABNORMAL HIGH (ref 0.61–1.24)
GFR, Estimated: 37 mL/min — ABNORMAL LOW (ref >60)
Glucose, Bld: 91 mg/dL (ref 70–99)
Potassium: 3.9 mmol/L (ref 3.5–5.1)
Sodium: 134 mmol/L — ABNORMAL LOW (ref 135–145)

## 2021-12-27 SURGERY — CYSTOSCOPY WITH RETROGRADE URETHROGRAM
Anesthesia: General | Site: Urethra

## 2021-12-27 MED ORDER — OXYCODONE HCL 5 MG/5ML PO SOLN: 5.0000 mg | Freq: Once | ORAL | Status: DC | PRN

## 2021-12-27 MED ORDER — 0.9 % SODIUM CHLORIDE (POUR BTL) OPTIME
TOPICAL | Status: DC | PRN
  Administered 2021-12-27: 500 mL

## 2021-12-27 MED ORDER — ONDANSETRON HCL 4 MG/2ML IJ SOLN
INTRAMUSCULAR | Status: AC
  Filled 2021-12-27: qty 2

## 2021-12-27 MED ORDER — LIDOCAINE HCL (PF) 2 % IJ SOLN
INTRAMUSCULAR | Status: AC
  Filled 2021-12-27: qty 5

## 2021-12-27 MED ORDER — DEXAMETHASONE SODIUM PHOSPHATE 10 MG/ML IJ SOLN
INTRAMUSCULAR | Status: DC | PRN
  Administered 2021-12-27: 10 mg via INTRAVENOUS

## 2021-12-27 MED ORDER — PROPOFOL 10 MG/ML IV BOLUS
INTRAVENOUS | Status: DC | PRN
  Administered 2021-12-27: 90 mg via INTRAVENOUS
  Administered 2021-12-27: 30 mg via INTRAVENOUS

## 2021-12-27 MED ORDER — ACETAMINOPHEN 500 MG PO TABS
1000.0000 mg | ORAL_TABLET | Freq: Once | ORAL | Status: AC
  Administered 2021-12-27: 1000 mg via ORAL

## 2021-12-27 MED ORDER — OXYCODONE HCL 5 MG PO TABS: 5.0000 mg | ORAL_TABLET | Freq: Once | ORAL | Status: DC | PRN

## 2021-12-27 MED ORDER — SODIUM CHLORIDE 0.9 % IR SOLN
Status: DC | PRN
  Administered 2021-12-27: 1500 mL

## 2021-12-27 MED ORDER — EPHEDRINE 5 MG/ML INJ
INTRAVENOUS | Status: AC
  Filled 2021-12-27: qty 5

## 2021-12-27 MED ORDER — FENTANYL CITRATE (PF) 100 MCG/2ML IJ SOLN
INTRAMUSCULAR | Status: AC
  Filled 2021-12-27: qty 2

## 2021-12-27 MED ORDER — FENTANYL CITRATE (PF) 100 MCG/2ML IJ SOLN: 25.0000 ug | INTRAMUSCULAR | Status: DC | PRN

## 2021-12-27 MED ORDER — ACETAMINOPHEN 500 MG PO TABS
ORAL_TABLET | ORAL | Status: AC
  Filled 2021-12-27: qty 2

## 2021-12-27 MED ORDER — CIPROFLOXACIN IN D5W 400 MG/200ML IV SOLN
INTRAVENOUS | Status: AC
  Filled 2021-12-27: qty 200

## 2021-12-27 MED ORDER — STERILE WATER FOR IRRIGATION IR SOLN
Status: DC | PRN
  Administered 2021-12-27: 500 mL

## 2021-12-27 MED ORDER — PROPOFOL 10 MG/ML IV BOLUS
INTRAVENOUS | Status: AC
  Filled 2021-12-27: qty 20

## 2021-12-27 MED ORDER — FENTANYL CITRATE (PF) 100 MCG/2ML IJ SOLN
INTRAMUSCULAR | Status: DC | PRN
  Administered 2021-12-27: 50 ug via INTRAVENOUS

## 2021-12-27 MED ORDER — ONDANSETRON HCL 4 MG/2ML IJ SOLN
INTRAMUSCULAR | Status: DC | PRN
  Administered 2021-12-27: 4 mg via INTRAVENOUS

## 2021-12-27 MED ORDER — IOHEXOL 300 MG/ML  SOLN
INTRAMUSCULAR | Status: DC | PRN
  Administered 2021-12-27: 10 mL via URETHRAL

## 2021-12-27 MED ORDER — EPHEDRINE SULFATE-NACL 50-0.9 MG/10ML-% IV SOSY
PREFILLED_SYRINGE | INTRAVENOUS | Status: DC | PRN
  Administered 2021-12-27: 10 mg via INTRAVENOUS

## 2021-12-27 MED ORDER — CIPROFLOXACIN IN D5W 400 MG/200ML IV SOLN
400.0000 mg | INTRAVENOUS | Status: AC
  Administered 2021-12-27: 400 mg via INTRAVENOUS

## 2021-12-27 MED ORDER — DEXAMETHASONE SODIUM PHOSPHATE 10 MG/ML IJ SOLN
INTRAMUSCULAR | Status: AC
  Filled 2021-12-27: qty 1

## 2021-12-27 MED ORDER — LIDOCAINE 2% (20 MG/ML) 5 ML SYRINGE
INTRAMUSCULAR | Status: DC | PRN
  Administered 2021-12-27: 60 mg via INTRAVENOUS

## 2021-12-27 MED ORDER — LACTATED RINGERS IV SOLN: INTRAVENOUS | Status: DC

## 2021-12-27 SURGICAL SUPPLY — 20 items
BAG DRAIN URO-CYSTO SKYTR STRL (DRAIN) ×2 IMPLANT
BAG DRN RND TRDRP ANRFLXCHMBR (UROLOGICAL SUPPLIES) ×1
BAG DRN UROCATH (DRAIN) ×1
BAG URINE DRAIN 2000ML AR STRL (UROLOGICAL SUPPLIES) ×1 IMPLANT
BALLN NEPHROSTOMY (BALLOONS) ×2
BALLOON NEPHROSTOMY (BALLOONS) IMPLANT
CATH FOLEY 2W COUNCIL 5CC 16FR (CATHETERS) ×1 IMPLANT
CATH INTERMIT  6FR 70CM (CATHETERS) ×1 IMPLANT
CLOTH BEACON ORANGE TIMEOUT ST (SAFETY) ×2 IMPLANT
GLOVE BIO SURGEON STRL SZ7.5 (GLOVE) ×2 IMPLANT
GOWN STRL REUS W/TWL LRG LVL3 (GOWN DISPOSABLE) ×2 IMPLANT
GUIDEWIRE STR DUAL SENSOR (WIRE) ×1 IMPLANT
HOLDER FOLEY CATH W/STRAP (MISCELLANEOUS) ×1 IMPLANT
IV NS IRRIG 3000ML ARTHROMATIC (IV SOLUTION) ×1 IMPLANT
KIT TURNOVER CYSTO (KITS) ×2 IMPLANT
MANIFOLD NEPTUNE II (INSTRUMENTS) ×1 IMPLANT
NS IRRIG 500ML POUR BTL (IV SOLUTION) ×2 IMPLANT
PACK CYSTO (CUSTOM PROCEDURE TRAY) ×2 IMPLANT
TUBE CONNECTING 12X1/4 (SUCTIONS) IMPLANT
WATER STERILE IRR 500ML POUR (IV SOLUTION) ×1 IMPLANT

## 2021-12-27 NOTE — Op Note (Signed)
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 Lorin Picket Tallin Hart  The patient has the above diagnosis and consented the above procedure.  He has bilateral hydronephrosis that is severe.  Preoperative antibiotics were given  21 French cystoscope was utilized.  Penile bulbar urethra normal.  He had a 6 [place] proximal bulbar urethral stricture.  He did a retrograde using a 6 [place] open-ended ureteral catheter cut to size.  You could see approximately 2 cm or less of narrowing beyond the stricture with dye in the bladder.  This was done in the AP view using 8 cc of contrast  Wire was easily placed through the small opening currently and a very large bladder.  Balloon dilation catheter was placed to the appropriate depth using markers.  I balloon dilated to 18 atm of pressure for 5 minutes.  I deflated the balloon and there is no bleeding.  He had immediate incontinence likely from overflow under anesthesia.  I cystoscoped the patient.  He had heavy bladder trabeculation.  He had approximately 1.5 cm bladder neck and proximal bulbar urethral stricture.  Photographs were taken.  Again he was leaking after the procedure.  Importantly the tissues were modestly rigid at the level of the stricture.  I scanned the bladder carefully and there was no carcinoma.  Trigone normal  I placed a 16 [place] council catheter into the bladder.  The left and at least 7 to 10 days and I will repeat the CT stone protocol and is renal function test and electrolytes.  Patient will have a trial of voiding.  He may need urodynamics.  Urodynamics is unlikely to change his management.  Hopefully he will not have incontinence from opening up the stricture.  I do not think he be able to perform  self-catheterization.

## 2021-12-28 ENCOUNTER — Encounter (HOSPITAL_BASED_OUTPATIENT_CLINIC_OR_DEPARTMENT_OTHER): Payer: Self-pay | Admitting: Urology

## 2021-12-29 ENCOUNTER — Other Ambulatory Visit: Payer: Self-pay

## 2021-12-29 ENCOUNTER — Emergency Department (HOSPITAL_COMMUNITY): Payer: Medicare Other

## 2021-12-29 ENCOUNTER — Emergency Department (HOSPITAL_COMMUNITY)
Admission: EM | Admit: 2021-12-29 | Discharge: 2021-12-29 | Disposition: A | Discharge status: Home / Self Care | Payer: Medicare Other | Attending: Emergency Medicine | Admitting: Emergency Medicine

## 2021-12-29 DIAGNOSIS — R39198 Other difficulties with micturition: Secondary | ICD-10-CM | POA: Insufficient documentation

## 2021-12-29 DIAGNOSIS — T83098A Other mechanical complication of other indwelling urethral catheter, initial encounter: Secondary | ICD-10-CM | POA: Insufficient documentation

## 2021-12-29 DIAGNOSIS — T83011A Breakdown (mechanical) of indwelling urethral catheter, initial encounter: Secondary | ICD-10-CM

## 2021-12-29 LAB — CBC WITH DIFFERENTIAL/PLATELET
Abs Immature Granulocytes: 0.03 10*3/uL (ref 0.00–0.07)
Basophils Absolute: 0 10*3/uL (ref 0.0–0.1)
Basophils Relative: 1 %
Eosinophils Absolute: 0 10*3/uL (ref 0.0–0.5)
Eosinophils Relative: 0 %
HCT: 28.5 % — ABNORMAL LOW (ref 39.0–52.0)
Hemoglobin: 9.6 g/dL — ABNORMAL LOW (ref 13.0–17.0)
Immature Granulocytes: 0 %
Lymphocytes Relative: 22 %
Lymphs Abs: 1.7 10*3/uL (ref 0.7–4.0)
MCH: 28.7 pg (ref 26.0–34.0)
MCHC: 33.7 g/dL (ref 30.0–36.0)
MCV: 85.3 fL (ref 80.0–100.0)
Monocytes Absolute: 0.7 10*3/uL (ref 0.1–1.0)
Monocytes Relative: 9 %
Neutro Abs: 5.1 10*3/uL (ref 1.7–7.7)
Neutrophils Relative %: 68 %
Platelets: 274 10*3/uL (ref 150–400)
RBC: 3.34 MIL/uL — ABNORMAL LOW (ref 4.22–5.81)
RDW: 13.7 % (ref 11.5–15.5)
WBC: 7.6 10*3/uL (ref 4.0–10.5)
nRBC: 0 % (ref 0.0–0.2)

## 2021-12-29 LAB — BASIC METABOLIC PANEL
Anion gap: 9 (ref 5–15)
BUN: 21 mg/dL (ref 8–23)
CO2: 23 mmol/L (ref 22–32)
Calcium: 8.6 mg/dL — ABNORMAL LOW (ref 8.9–10.3)
Chloride: 99 mmol/L (ref 98–111)
Creatinine, Ser: 1.83 mg/dL — ABNORMAL HIGH (ref 0.61–1.24)
GFR, Estimated: 36 mL/min — ABNORMAL LOW (ref >60)
Glucose, Bld: 95 mg/dL (ref 70–99)
Potassium: 3.8 mmol/L (ref 3.5–5.1)
Sodium: 131 mmol/L — ABNORMAL LOW (ref 135–145)

## 2021-12-29 LAB — URINALYSIS, ROUTINE W REFLEX MICROSCOPIC
Bilirubin Urine: NEGATIVE
Glucose, UA: NEGATIVE mg/dL
Ketones, ur: 5 mg/dL — AB
Nitrite: NEGATIVE
Protein, ur: 100 mg/dL — AB
RBC / HPF: >50 RBC/hpf — ABNORMAL HIGH (ref 0–5)
Specific Gravity, Urine: 1.01 (ref 1.005–1.030)
WBC, UA: >50 WBC/hpf — ABNORMAL HIGH (ref 0–5)
pH: 5 (ref 5.0–8.0)

## 2021-12-29 MED ORDER — CEPHALEXIN 500 MG PO CAPS: 500.0000 mg | ORAL_CAPSULE | Freq: Three times a day (TID) | ORAL | 0 refills | Status: DC

## 2021-12-29 MED ORDER — CEPHALEXIN 500 MG PO CAPS
500.0000 mg | ORAL_CAPSULE | Freq: Once | ORAL | Status: AC
  Administered 2021-12-29: 500 mg via ORAL
  Filled 2021-12-29: qty 1

## 2021-12-29 NOTE — ED Notes (Signed)
Attempting to contact daughter to verify she or someone is coming to pick up pt.

## 2021-12-29 NOTE — ED Notes (Signed)
Pt gave verbal consent to reveal personal information regarding visit to his daughter Vivianne Spence by phone.

## 2021-12-29 NOTE — ED Notes (Signed)
Spoke with pts daughter, someone will be here in about an hour to take him home.

## 2021-12-29 NOTE — ED Triage Notes (Signed)
Patient was brought to the ED by a neighbor.  Patient states he cut his tube. When asked, the patient stated that he did not know why he cut the foley cath bag. Patient has a new bag with him. There is blood in the foley cath tubing.  Patient is confused. It appears in triage that the patient has pulled the foley cath and the balloon is in the penis.

## 2021-12-29 NOTE — ED Notes (Signed)
Pt resting without complaints while waiting for ride.

## 2021-12-29 NOTE — ED Provider Notes (Signed)
[facility] DEPT Provider Note   CSN: [id number] Arrival date & time: 12/29/21  9485     History  No chief complaint on file.   Ricky Young is a 81 y.o. male.  Patient presents with a Foley catheter dysfunction.  He underwent a cystoscopy by Dr. Matilde Sprang 2 days ago for urethral stricture.  He has a Foley catheter in place.  He apparently somehow cut the tubing of the Foley accidentally.  He states he does not know why he cut it.  He does appear to be somewhat confused.  Nursing noticed some blood in the Foley catheter tubing.  Patient denies any pain.  States he is still able to urinate.  No fevers, chills, nausea or vomiting.  No testicular pain  Cystoscopy retrograde urethrogram balloon dilation of bladder neck contracture and urethral stricture on 6/27  The history is provided by the patient.       Home Medications Prior to Admission medications   Medication Sig Start Date End Date Taking? Authorizing Provider  levothyroxine (SYNTHROID) 25 MCG tablet Take 1 tablet by mouth at bedtime. 12/26/18   [provider]  meloxicam (MOBIC) 7.5 MG tablet Take 7.5 mg by mouth daily. 12/12/21   [provider]  mirtazapine (REMERON) 15 MG tablet Take 1 tablet by mouth daily. 12/26/18   [provider]  MYRBETRIQ 50 MG TB24 tablet Take 50 mg by mouth at bedtime. 03/23/21   [provider]  pravastatin (PRAVACHOL) 40 MG tablet Take 40 mg by mouth at bedtime.    [provider]      Allergies    Patient has no known allergies.    Review of Systems   Review of Systems  Constitutional:  Negative for activity change, appetite change and fever.  Gastrointestinal:  Negative for abdominal pain, nausea and vomiting.  Genitourinary:  Positive for decreased urine volume and difficulty urinating.  Musculoskeletal:  Negative for arthralgias and myalgias.   all other systems are negative except as noted in the HPI and PMH.     Physical Exam Updated Vital Signs BP (!) 173/83 (BP Location: Right Arm)   Pulse 71   Temp 98.3 F (36.8 C) (Oral)   Resp 18   Ht '5\' 3"'$  (1.6 m)   Wt 59.2 kg   SpO2 99%   BMI 23.13 kg/m  Physical Exam Vitals and nursing note reviewed.  Constitutional:      General: He is not in acute distress.    Appearance: He is well-developed.  HENT:     Head: Normocephalic and atraumatic.     Mouth/Throat:     Pharynx: No oropharyngeal exudate.  Eyes:     Conjunctiva/sclera: Conjunctivae normal.     Pupils: Pupils are equal, round, and reactive to light.  Neck:     Comments: No meningismus. Cardiovascular:     Rate and Rhythm: Normal rate and regular rhythm.     Heart sounds: Normal heart sounds. No murmur heard. Pulmonary:     Effort: Pulmonary effort is normal. No respiratory distress.     Breath sounds: Normal breath sounds.  Abdominal:     Palpations: Abdomen is soft.     Tenderness: There is no abdominal tenderness. There is no guarding or rebound.  Genitourinary:    Comments: Foley catheter is in place.  Balloon appears to be within the bladder on ultrasound. Foley catheter tubing and bag was changed.  We will attempt to irrigate Musculoskeletal:  General: No tenderness. Normal range of motion.     Cervical back: Normal range of motion and neck supple.  Skin:    General: Skin is warm.  Neurological:     Mental Status: He is alert and oriented to person, place, and time.     Cranial Nerves: No cranial nerve deficit.     Motor: No abnormal muscle tone.     Coordination: Coordination normal.     Comments:  5/5 strength throughout. CN 2-12 intact.Equal grip strength.   Psychiatric:        Behavior: Behavior normal.     ED Results / Procedures / Treatments   Labs (all labs ordered are listed, but only abnormal results are displayed) Labs Reviewed  CBC WITH DIFFERENTIAL/PLATELET - Abnormal; Notable for the following components:      Result Value   RBC 3.34  (*)    Hemoglobin 9.6 (*)    HCT 28.5 (*)    All other components within normal limits  BASIC METABOLIC PANEL - Abnormal; Notable for the following components:   Sodium 131 (*)    Creatinine, Ser 1.83 (*)    Calcium 8.6 (*)    GFR, Estimated 36 (*)    All other components within normal limits  URINALYSIS, ROUTINE W REFLEX MICROSCOPIC - Abnormal; Notable for the following components:   APPearance HAZY (*)    Hgb urine dipstick LARGE (*)    Ketones, ur 5 (*)    Protein, ur 100 (*)    Leukocytes,Ua LARGE (*)    RBC / HPF >50 (*)    WBC, UA >50 (*)    Bacteria, UA RARE (*)    All other components within normal limits  URINE CULTURE    EKG None  Radiology No results found.  Procedures Procedures    Medications Ordered in ED Medications - No data to display  ED Course/ Medical Decision Making/ A&P                           Medical Decision Making Amount and/or Complexity of Data Reviewed Labs: ordered. Decision-making details documented in ED Course. Radiology: ordered and independent interpretation performed. Decision-making details documented in ED Course. ECG/medicine tests: ordered and independent interpretation performed. Decision-making details documented in ED Course.  Risk Prescription drug management.   Foley catheter malfunction.  Abdomen soft without peritoneal signs.  Foley catheter appears to be in place on ultrasound.  The bag was replaced and tubing was replaced.  It is able to irrigate and drain normally.  He denies any pain.  Discussed with Dr. Gilford Rile of urology.  He agrees with not disturbing the Foley itself as it properly positioned and the patient just had surgery.  Creatinine is stable compared to 2 days ago.  Urinalysis is positive for likely infection and culture is sent.  Foley catheter in appropriate position on ultrasound.  Appears stable for follow-up with urology as scheduled. Return precautions discussed.         Final  Clinical Impression(s) / ED Diagnoses Final diagnoses:  Malfunction of Foley catheter, initial encounter [facility])    Rx / DC Orders ED Discharge Orders     None         Carri Spillers, Annie Main, MD 12/29/21 1301

## 2021-12-29 NOTE — ED Notes (Signed)
Waiting for daughter to arrive to transport pt home.

## 2021-12-29 NOTE — ED Notes (Signed)
Irrigated foley with clear return of NS, pt had no pain or pressure during irrigation. Foley has been secured to thigh with stat lock.

## 2021-12-29 NOTE — ED Notes (Signed)
Unable to finish triage. Patient is confused.

## 2021-12-29 NOTE — ED Notes (Signed)
Spoke with daughter who stated sister is on the way, she was stopped in traffic and should be here in 30 min.

## 2021-12-29 NOTE — Discharge Instructions (Signed)
Follow-up with the urologist as scheduled.  Take the antibiotics as prescribed.  Return to the ED with worsening symptoms including pain, fever, not able to urinate or any other concerns

## 2021-12-30 LAB — URINE CULTURE: Culture: NO GROWTH

## 2022-01-11 ENCOUNTER — Emergency Department (HOSPITAL_COMMUNITY): Payer: Medicare Other

## 2022-01-11 ENCOUNTER — Other Ambulatory Visit: Payer: Self-pay

## 2022-01-11 ENCOUNTER — Observation Stay (HOSPITAL_COMMUNITY)
Admission: EM | Admit: 2022-01-11 | Discharge: 2022-01-12 | Disposition: A | Discharge status: Home / Self Care | Payer: Medicare Other | Attending: Student | Admitting: Student

## 2022-01-11 ENCOUNTER — Encounter (HOSPITAL_COMMUNITY): Payer: Self-pay

## 2022-01-11 DIAGNOSIS — N1831 Chronic kidney disease, stage 3a: Secondary | ICD-10-CM | POA: Diagnosis not present

## 2022-01-11 DIAGNOSIS — R638 Other symptoms and signs concerning food and fluid intake: Secondary | ICD-10-CM | POA: Insufficient documentation

## 2022-01-11 DIAGNOSIS — Z79899 Other long term (current) drug therapy: Secondary | ICD-10-CM | POA: Insufficient documentation

## 2022-01-11 DIAGNOSIS — R339 Retention of urine, unspecified: Secondary | ICD-10-CM | POA: Insufficient documentation

## 2022-01-11 DIAGNOSIS — F0390 Unspecified dementia without behavioral disturbance: Secondary | ICD-10-CM | POA: Insufficient documentation

## 2022-01-11 DIAGNOSIS — R634 Abnormal weight loss: Secondary | ICD-10-CM | POA: Insufficient documentation

## 2022-01-11 DIAGNOSIS — E876 Hypokalemia: Secondary | ICD-10-CM | POA: Diagnosis not present

## 2022-01-11 DIAGNOSIS — Z8546 Personal history of malignant neoplasm of prostate: Secondary | ICD-10-CM | POA: Diagnosis not present

## 2022-01-11 DIAGNOSIS — E785 Hyperlipidemia, unspecified: Secondary | ICD-10-CM | POA: Diagnosis present

## 2022-01-11 DIAGNOSIS — E872 Acidosis, unspecified: Secondary | ICD-10-CM | POA: Insufficient documentation

## 2022-01-11 DIAGNOSIS — E039 Hypothyroidism, unspecified: Secondary | ICD-10-CM | POA: Insufficient documentation

## 2022-01-11 DIAGNOSIS — G629 Polyneuropathy, unspecified: Secondary | ICD-10-CM

## 2022-01-11 DIAGNOSIS — F320 Major depressive disorder, single episode, mild: Secondary | ICD-10-CM | POA: Diagnosis present

## 2022-01-11 DIAGNOSIS — E871 Hypo-osmolality and hyponatremia: Principal | ICD-10-CM | POA: Insufficient documentation

## 2022-01-11 LAB — URINALYSIS, ROUTINE W REFLEX MICROSCOPIC
Bilirubin Urine: NEGATIVE
Glucose, UA: NEGATIVE mg/dL
Ketones, ur: NEGATIVE mg/dL
Nitrite: NEGATIVE
Protein, ur: NEGATIVE mg/dL
Specific Gravity, Urine: 1.002 — ABNORMAL LOW (ref 1.005–1.030)
pH: 7 (ref 5.0–8.0)

## 2022-01-11 LAB — COMPREHENSIVE METABOLIC PANEL
ALT: 20 U/L (ref 0–44)
AST: 31 U/L (ref 15–41)
Albumin: 3.3 g/dL — ABNORMAL LOW (ref 3.5–5.0)
Alkaline Phosphatase: 57 U/L (ref 38–126)
Anion gap: 9 (ref 5–15)
BUN: 5 mg/dL — ABNORMAL LOW (ref 8–23)
CO2: 19 mmol/L — ABNORMAL LOW (ref 22–32)
Calcium: 8.4 mg/dL — ABNORMAL LOW (ref 8.9–10.3)
Chloride: 94 mmol/L — ABNORMAL LOW (ref 98–111)
Creatinine, Ser: 1.55 mg/dL — ABNORMAL HIGH (ref 0.61–1.24)
GFR, Estimated: 44 mL/min — ABNORMAL LOW (ref >60)
Glucose, Bld: 90 mg/dL (ref 70–99)
Potassium: 3.8 mmol/L (ref 3.5–5.1)
Sodium: 122 mmol/L — ABNORMAL LOW (ref 135–145)
Total Bilirubin: 0.4 mg/dL (ref 0.3–1.2)
Total Protein: 5.9 g/dL — ABNORMAL LOW (ref 6.5–8.1)

## 2022-01-11 LAB — SODIUM, URINE, RANDOM: Sodium, Ur: 34 mmol/L

## 2022-01-11 LAB — CBC WITH DIFFERENTIAL/PLATELET
Abs Immature Granulocytes: 0.02 10*3/uL (ref 0.00–0.07)
Basophils Absolute: 0.1 10*3/uL (ref 0.0–0.1)
Basophils Relative: 1 %
Eosinophils Absolute: 0.1 10*3/uL (ref 0.0–0.5)
Eosinophils Relative: 1 %
HCT: 28.5 % — ABNORMAL LOW (ref 39.0–52.0)
Hemoglobin: 9.4 g/dL — ABNORMAL LOW (ref 13.0–17.0)
Immature Granulocytes: 0 %
Lymphocytes Relative: 29 %
Lymphs Abs: 2.7 10*3/uL (ref 0.7–4.0)
MCH: 27.8 pg (ref 26.0–34.0)
MCHC: 33 g/dL (ref 30.0–36.0)
MCV: 84.3 fL (ref 80.0–100.0)
Monocytes Absolute: 0.9 10*3/uL (ref 0.1–1.0)
Monocytes Relative: 9 %
Neutro Abs: 5.6 10*3/uL (ref 1.7–7.7)
Neutrophils Relative %: 60 %
Platelets: 264 10*3/uL (ref 150–400)
RBC: 3.38 MIL/uL — ABNORMAL LOW (ref 4.22–5.81)
RDW: 12.6 % (ref 11.5–15.5)
WBC: 9.4 10*3/uL (ref 4.0–10.5)
nRBC: 0 % (ref 0.0–0.2)

## 2022-01-11 LAB — OSMOLALITY: Osmolality: 250 mOsm/kg — ABNORMAL LOW (ref 275–295)

## 2022-01-11 NOTE — ED Provider Notes (Signed)
[facility] EMERGENCY DEPARTMENT Provider Note   CSN: [id number] Arrival date & time: 01/11/22  1718     History  Chief Complaint  Patient presents with   Abnormal Lab    Ricky Young is a 81 y.o. male.  HPI 81 year old male with a history of anxiety, depression, prediabetes, hyperlipidemia, prostate cancer, hypothyroidism, cystoscopy with retrograde urethrogram, current foley in place presents to the ER with hyponatremia after being sent by his PCP.  Patient had labs drawn earlier today and was noted to have a sodium of 123.  History provided by daughter at bedside.  Patient has been having ongoing low sodiums however has never been this low.  Daughter states that he runs about 128-130.  She has noticed that he has been slightly more confused than normal which prompted the PCP visit and has had progressive poor PO intake over the last several weeks. Daughter states he has lost 8 lbs in the last 3 weeks. He follows with Dr. Sherron Monday with urology, has plans to have his Foley removed tomorrow and have a bladder scan.  No fevers but daughter states that he has had a persistent cough over the last several days.  The patient denies any current complaints.  He has had no falls or recent head injuries.  No focal deficits.    Home Medications Prior to Admission medications   Medication Sig Start Date End Date Taking? Authorizing Provider  cephALEXin (KEFLEX) 500 MG capsule Take 1 capsule (500 mg total) by mouth 3 (three) times daily. 12/29/21   Rancour, Jeannett Senior, MD  levothyroxine (SYNTHROID) 25 MCG tablet Take 1 tablet by mouth at bedtime. 12/26/18   [provider]  meloxicam (MOBIC) 7.5 MG tablet Take 7.5 mg by mouth daily. 12/12/21   [provider]  mirtazapine (REMERON) 15 MG tablet Take 1 tablet by mouth daily. 12/26/18   [provider]  MYRBETRIQ 50 MG TB24 tablet Take 50 mg by mouth at bedtime. 03/23/21   [provider]  pravastatin  (PRAVACHOL) 40 MG tablet Take 40 mg by mouth at bedtime.    [provider]      Allergies    Patient has no known allergies.    Review of Systems   Review of Systems Ten systems reviewed and are negative for acute change, except as noted in the HPI.   Physical Exam Updated Vital Signs BP (!) 148/65   Pulse (!) 49   Temp 99.6 F (37.6 C) (Oral)   Resp 17   SpO2 100%  Physical Exam Vitals and nursing note reviewed.  Constitutional:      General: He is not in acute distress.    Appearance: He is well-developed.     Comments: Pleasant, nontoxic-appearing, resting comfortably in the ER bed  HENT:     Head: Normocephalic and atraumatic.  Eyes:     Conjunctiva/sclera: Conjunctivae normal.  Cardiovascular:     Rate and Rhythm: Normal rate and regular rhythm.     Heart sounds: No murmur heard. Pulmonary:     Effort: Pulmonary effort is normal. No respiratory distress.     Breath sounds: Normal breath sounds.  Abdominal:     Palpations: Abdomen is soft.     Tenderness: There is no abdominal tenderness.  Musculoskeletal:        General: No swelling.     Cervical back: Neck supple.  Skin:    General: Skin is warm and dry.     Capillary Refill: Capillary  refill takes less than 2 seconds.  Neurological:     General: No focal deficit present.     Mental Status: He is alert and oriented to person, place, and time.     Comments: A&Ox1, answers questions appropriately, moving all 4 extremities without difficulty, equal strength in upper and lower extremities   Psychiatric:        Mood and Affect: Mood normal.     ED Results / Procedures / Treatments   Labs (all labs ordered are listed, but only abnormal results are displayed) Labs Reviewed  COMPREHENSIVE METABOLIC PANEL - Abnormal; Notable for the following components:      Result Value   Sodium 122 (*)    Chloride 94 (*)    CO2 19 (*)    BUN 5 (*)    Creatinine, Ser 1.55 (*)    Calcium 8.4 (*)    Total Protein  5.9 (*)    Albumin 3.3 (*)    GFR, Estimated 44 (*)    All other components within normal limits  CBC WITH DIFFERENTIAL/PLATELET - Abnormal; Notable for the following components:   RBC 3.38 (*)    Hemoglobin 9.4 (*)    HCT 28.5 (*)    All other components within normal limits  OSMOLALITY - Abnormal; Notable for the following components:   Osmolality 250 (*)    All other components within normal limits  URINALYSIS, ROUTINE W REFLEX MICROSCOPIC - Abnormal; Notable for the following components:   Color, Urine COLORLESS (*)    Specific Gravity, Urine 1.002 (*)    Hgb urine dipstick MODERATE (*)    Leukocytes,Ua TRACE (*)    Bacteria, UA RARE (*)    All other components within normal limits  URINE CULTURE  SODIUM, URINE, RANDOM  TSH    EKG None  Radiology CT Head Wo Contrast  Result Date: 01/11/2022 CLINICAL DATA:  Mental status change, persistent or worsening EXAM: CT HEAD WITHOUT CONTRAST TECHNIQUE: Contiguous axial images were obtained from the base of the skull through the vertex without intravenous contrast. RADIATION DOSE REDUCTION: This exam was performed according to the departmental dose-optimization program which includes automated exposure control, adjustment of the mA and/or kV according to patient size and/or use of iterative reconstruction technique. COMPARISON:  None Available. FINDINGS: Brain: Normal anatomic configuration. Parenchymal volume loss is commensurate with the patient's age. Mild periventricular white matter changes are present likely reflecting the sequela of small vessel ischemia. No abnormal intra or extra-axial mass lesion or fluid collection. No abnormal mass effect or midline shift. No evidence of acute intracranial hemorrhage or infarct. Ventricular size is normal. Cerebellum unremarkable. Vascular: No asymmetric hyperdense vasculature at the skull base. Skull: Intact Sinuses/Orbits: Paranasal sinuses are clear. Orbits are unremarkable. Other: Mastoid air  cells and middle ear cavities are clear. IMPRESSION: No acute intracranial hemorrhage or infarct. Mild senescent change. Electronically Signed   By: Helyn Numbers M.D.   On: 01/11/2022 23:26   DG Chest Portable 1 View  Result Date: 01/11/2022 CLINICAL DATA:  Cough. EXAM: PORTABLE CHEST 1 VIEW COMPARISON:  Abdominal series with chest x-ray 06/28/2008 FINDINGS: Heart is borderline enlarged. Mediastinal silhouette within normal limits. No focal lung infiltrate, pleural effusion or pneumothorax identified. Surgical clips overlie the upper abdomen. No acute fractures. IMPRESSION: No active disease. Electronically Signed   By: Darliss Cheney M.D.   On: 01/11/2022 23:18    Procedures Procedures    Medications Ordered in ED Medications - No data to display  ED Course/  Medical Decision Making/ A&P Clinical Course as of 01/12/22 0045  Thu Jan 12, 2022  0043 Meldin [MB]    Clinical Course User Index [MB] Mare Ferrari, PA-C                           Medical Decision Making Amount and/or Complexity of Data Reviewed Labs: ordered. Radiology: ordered.   81 year old male presenting with hyponatremia.  Vitals on arrival with a blood pressure of 181/75 on arrival, improved to 163/78.  Temp of 99.6, not tachycardic, tachypneic or hypoxic.  He is alert and oriented x1, daughter states he is usually alert and oriented x2 however is not able to provide good details about his overall baseline mental status.  He does have dementia and lives alone but has family living close by.  He has had ongoing issues with hyponatremia per the daughter, however has never been as low as 122.  She states his sodium  This patient presents to the ED for concern of hyponatremia this involves a number of treatment options, and is a complaint that carries with it a high risk of complications and morbidity.  The differential diagnosis includes hypovolemic hyponatremia, drug induced, hypothyroidism   Co morbidities: Discussed  in HPI   EMR reviewed including pt PMHx, past surgical history and past visits to ER.   See HPI for more details   Lab Tests:  I ordered and independently interpreted labs.  The pertinent results include:    Labs notable for CBC without leukocytosis, hemoglobin of 9.4 which appears to be baseline CMP with sodium of 122, CO2 of 19, creatinine 1.55 which appears to be at baseline UA with moderate hemoglobin, trace leukocytes and bacteria TSH pending Osmolarity 250  Imaging Studies:  NAD. I personally reviewed all imaging studies and no acute abnormality found. I agree with radiology interpretation.  Chest x-ray and CT of the head for acute findings  Cardiac Monitoring:  No EKG indicated at this time   Reevaluation:  After the interventions noted above I re-evaluated patient and found that they have :stayed the same    Problem List / ED Course: Patient presenting with hyponatremia.  He has overall well-appearing, answering questions, though daughter states that he is slightly a little bit more confused than normal.  He has had progressive poor p.o. intake over the last several weeks and a 6 pound weight loss.  He has a sodium of 122.  His UA has some trace leukocytes and rare bacteria though this does come from a Foley.  He is not septic.  His CT head is negative for acute findings.  Chest x-ray without evidence of pneumonia.   Dispostion:  After consideration of the diagnostic results and the patients response to treatment, I feel that the patent would benefit from admission for further work-up and volume resuscitation.  Spoke w/ Dr. Charma Igo who will admit the patient for further evaluation and treatment   Final Clinical Impression(s) / ED Diagnoses Final diagnoses:  Hyponatremia    Rx / DC Orders ED Discharge Orders     None         Leone Brand 01/12/22 0045    Tilden Fossa, MD 01/12/22 [phone]

## 2022-01-11 NOTE — ED Triage Notes (Signed)
Sent by PCP for sodium of 123.  Denies symptoms.  BUt family reports dementia and they have been dealing with his sodium.  Patient has foley catheter upon triage.

## 2022-01-11 NOTE — ED Provider Triage Note (Signed)
Emergency Medicine Provider Triage Evaluation Note  Ricky Young , a 81 y.o. male  was evaluated in triage.  Pt complains of low sodium.  Patient has a history of dementia and is confused at baseline per family member.  Family reports that patient has seemed more confused today than usual.  Reports that patient had lab tests drawn last week and was contacted today that his sodium was low and he needed to come to the emergency department for further evaluation.  Member reports that patient lives at home and she is unsure how he has been eating and drinking at home.  Review of Systems  Positive:  Negative:   Unable to obtain due to dementia  Physical Exam  BP (!) 181/75 (BP Location: Left Arm)   Pulse 63   Temp 99.6 F (37.6 C) (Oral)   Resp 14   SpO2 100%  Gen:   Awake, no distress , alert to person and place only Resp:  Normal effort  MSK:   Moves extremities without difficulty  Other:  Abdomen soft, nontender, nontender with no guarding or rebound tenderness.  Foley catheter in place with light-colored urine.  Medical Decision Making  Medically screening exam initiated at 5:54 PM.  Appropriate orders placed.  ABDULAHI SCHOR was informed that the remainder of the evaluation will be completed by another provider, this initial triage assessment does not replace that evaluation, and the importance of remaining in the ED until their evaluation is complete.     Loni Beckwith, [place] 01/11/22 1755

## 2022-01-12 DIAGNOSIS — E871 Hypo-osmolality and hyponatremia: Secondary | ICD-10-CM

## 2022-01-12 DIAGNOSIS — Z8546 Personal history of malignant neoplasm of prostate: Secondary | ICD-10-CM

## 2022-01-12 DIAGNOSIS — F0390 Unspecified dementia without behavioral disturbance: Secondary | ICD-10-CM

## 2022-01-12 DIAGNOSIS — E785 Hyperlipidemia, unspecified: Secondary | ICD-10-CM

## 2022-01-12 DIAGNOSIS — E039 Hypothyroidism, unspecified: Secondary | ICD-10-CM

## 2022-01-12 DIAGNOSIS — R339 Retention of urine, unspecified: Secondary | ICD-10-CM

## 2022-01-12 DIAGNOSIS — N1831 Chronic kidney disease, stage 3a: Secondary | ICD-10-CM

## 2022-01-12 DIAGNOSIS — R634 Abnormal weight loss: Secondary | ICD-10-CM

## 2022-01-12 DIAGNOSIS — E876 Hypokalemia: Secondary | ICD-10-CM | POA: Diagnosis not present

## 2022-01-12 DIAGNOSIS — E872 Acidosis, unspecified: Secondary | ICD-10-CM

## 2022-01-12 DIAGNOSIS — R638 Other symptoms and signs concerning food and fluid intake: Secondary | ICD-10-CM

## 2022-01-12 LAB — BASIC METABOLIC PANEL
Anion gap: 11 (ref 5–15)
Anion gap: 12 (ref 5–15)
Anion gap: 11 (ref 5–15)
BUN: 8 mg/dL (ref 8–23)
BUN: 6 mg/dL — ABNORMAL LOW (ref 8–23)
BUN: 5 mg/dL — ABNORMAL LOW (ref 8–23)
CO2: 18 mmol/L — ABNORMAL LOW (ref 22–32)
CO2: 16 mmol/L — ABNORMAL LOW (ref 22–32)
CO2: 19 mmol/L — ABNORMAL LOW (ref 22–32)
Calcium: 8.5 mg/dL — ABNORMAL LOW (ref 8.9–10.3)
Calcium: 8.6 mg/dL — ABNORMAL LOW (ref 8.9–10.3)
Calcium: 8.6 mg/dL — ABNORMAL LOW (ref 8.9–10.3)
Chloride: 98 mmol/L (ref 98–111)
Chloride: 100 mmol/L (ref 98–111)
Chloride: 98 mmol/L (ref 98–111)
Creatinine, Ser: 1.5 mg/dL — ABNORMAL HIGH (ref 0.61–1.24)
Creatinine, Ser: 1.45 mg/dL — ABNORMAL HIGH (ref 0.61–1.24)
Creatinine, Ser: 1.5 mg/dL — ABNORMAL HIGH (ref 0.61–1.24)
GFR, Estimated: 46 mL/min — ABNORMAL LOW (ref >60)
GFR, Estimated: 48 mL/min — ABNORMAL LOW (ref >60)
GFR, Estimated: 46 mL/min — ABNORMAL LOW (ref >60)
Glucose, Bld: 85 mg/dL (ref 70–99)
Glucose, Bld: 69 mg/dL — ABNORMAL LOW (ref 70–99)
Glucose, Bld: 81 mg/dL (ref 70–99)
Potassium: 4.1 mmol/L (ref 3.5–5.1)
Potassium: 3.3 mmol/L — ABNORMAL LOW (ref 3.5–5.1)
Potassium: 3.8 mmol/L (ref 3.5–5.1)
Sodium: 127 mmol/L — ABNORMAL LOW (ref 135–145)
Sodium: 128 mmol/L — ABNORMAL LOW (ref 135–145)
Sodium: 128 mmol/L — ABNORMAL LOW (ref 135–145)

## 2022-01-12 LAB — OSMOLALITY, URINE: Osmolality, Ur: 95 mOsm/kg — ABNORMAL LOW (ref 300–900)

## 2022-01-12 LAB — TSH: TSH: 3.482 u[IU]/mL (ref 0.350–4.500)

## 2022-01-12 LAB — MAGNESIUM: Magnesium: 1.9 mg/dL (ref 1.7–2.4)

## 2022-01-12 MED ORDER — ACETAMINOPHEN 325 MG PO TABS: 650.0000 mg | ORAL_TABLET | Freq: Four times a day (QID) | ORAL | 2 refills | Status: AC | PRN

## 2022-01-12 MED ORDER — PRAVASTATIN SODIUM 40 MG PO TABS
40.0000 mg | ORAL_TABLET | Freq: Every day | ORAL | Status: DC
  Administered 2022-01-12: 40 mg via ORAL
  Filled 2022-01-12: qty 1

## 2022-01-12 MED ORDER — LEVOTHYROXINE SODIUM 25 MCG PO TABS
25.0000 ug | ORAL_TABLET | Freq: Every day | ORAL | Status: DC
  Administered 2022-01-12: 25 ug via ORAL
  Filled 2022-01-12: qty 1

## 2022-01-12 MED ORDER — ENOXAPARIN SODIUM 30 MG/0.3ML IJ SOSY: 30.0000 mg | PREFILLED_SYRINGE | Freq: Every day | INTRAMUSCULAR | Status: DC

## 2022-01-12 MED ORDER — POTASSIUM CHLORIDE CRYS ER 20 MEQ PO TBCR
40.0000 meq | EXTENDED_RELEASE_TABLET | ORAL | Status: DC
  Administered 2022-01-12: 40 meq via ORAL
  Filled 2022-01-12: qty 2

## 2022-01-12 MED ORDER — SODIUM CHLORIDE 0.9 % IV SOLN: INTRAVENOUS | Status: DC

## 2022-01-12 MED ORDER — SODIUM CHLORIDE 0.9% FLUSH: 3.0000 mL | Freq: Two times a day (BID) | INTRAVENOUS | Status: DC

## 2022-01-12 NOTE — Discharge Summary (Signed)
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  Also concern about decreased oral intake and unintentional weight loss for 2 to 3 weeks.  Per patient's daughter, patient lives by himself.  Concern about noncompliance with his medication as well.  In ED, slightly bradycardic to 50s and 60s.  SBP slightly elevated with systolic to 034 742V. Na 122 (baseline about 128). Cr 1.55.  Urine sodium 34.  TSH normal.  CXR and CT head without acute finding.  Urine culture obtained.  Patient was admitted for hyponatremia in the setting of dehydration and decreased oral intake.  He was started on IV normal saline.  The next morning, sodium improved to 128 in about 12 hours.  IV NS discontinued.  He otherwise felt well.  He is discharged home to stay with his daughter and other family members.   Patient's daughter to take patient with her.  Has been counseled on the importance of good hydration.   Assessment and plan Hyponatremia: Likely due to dehydration in the setting of poor p.o. intake and dementia.  Improved with IV fluid. 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  Just completed a course of Keflex.  Urine culture with 20 colonies of GNR but obtained from old Foley.  Low suspicion for UTI  Dementia without behavioral disturbance: Likely contributing to his dehydration and hyponatremia.  Lives by himself prior to admission -Daughter to take patient home with herself -Advised to ensure adequate hydration. -Recommend goal of care discussion and advanced directives moving forward  Hypokalemia: Resolved.  CKD-3A stable. -Discontinued Mobic -Tylenol as needed pain  Hypothyroidism-continue Synthroid  Metabolic acidosis: Improved.  Anxiety/depression: Stable -Continue home meds   Consultants: None Procedures performed: None Disposition: Relative's home Diet recommendation:  Discharge Diet Orders (From admission, onward)     Start     Ordered   01/12/22 0000  Diet general        01/12/22 0906           Regular diet DISCHARGE MEDICATION: Allergies as of 01/12/2022   No Known Allergies      Medication List     STOP taking these medications    cephALEXin 500 MG capsule Commonly known as: KEFLEX   meloxicam 7.5 MG tablet Commonly known as: MOBIC       TAKE these medications    acetaminophen  325 MG tablet Commonly known as: Tylenol Take 2 tablets (650 mg total) by mouth every 6 (six) hours as needed for moderate pain, mild pain, headache or fever.   donepezil 5 MG tablet Commonly known as: ARICEPT Take 5 mg by mouth at bedtime.   levothyroxine 25 MCG tablet Commonly known as: SYNTHROID Take 25 mcg by mouth daily before breakfast.   megestrol 40 MG tablet Commonly  known as: MEGACE Take 40 mg by mouth daily.   mirtazapine 15 MG tablet Commonly known as: REMERON Take 15 mg by mouth daily.   pravastatin 20 MG tablet Commonly known as: PRAVACHOL Take 20 mg by mouth at bedtime.        Follow-up Information     Benito Mccreedy, MD .   Specialty: Internal Medicine Contact information: [address] [address] [phone]                Discharge Exam: Vitals:   01/12/22 0345 01/12/22 0400 01/12/22 0800 01/12/22 0950  BP: 136/74 (!) 157/73 134/87 (!) 152/94  Pulse: 63 60 68 65  Resp: 13 (!) 21 (!) 25 20  Temp:    98.8 F (37.1 C)  TempSrc:    Oral  SpO2: 100% 100% 98% 98%   GENERAL: Appears frail.  Nontoxic. HEENT: MMM.  Vision and hearing grossly intact.  NECK: Supple.  No apparent JVD.  RESP:  No IWOB.  Fair aeration bilaterally. CVS:  RRR. Heart sounds normal.  ABD/GI/GU: BS+. Abd soft, NTND.  MSK/EXT:  Moves extremities.  Significant muscle mass and subcu fat loss. SKIN: no apparent skin lesion or wound NEURO: Awake and alert. Oriented to self, person and place but not time.  No insight into his situation.  No apparent focal neuro deficit. PSYCH: Calm. Normal affect.   Condition at discharge: fair  The results of significant diagnostics from this hospitalization (including imaging, microbiology, ancillary and laboratory) are listed below for reference.   Imaging Studies: CT Head Wo Contrast  Result Date: 01/11/2022 CLINICAL DATA:  Mental status change, persistent or worsening EXAM: CT HEAD WITHOUT CONTRAST TECHNIQUE: Contiguous axial images were obtained from the base of the skull through the vertex without intravenous contrast. RADIATION DOSE REDUCTION: This exam was performed according to the departmental dose-optimization program which includes automated exposure control, adjustment of the mA and/or kV according to patient size and/or use of iterative reconstruction technique. COMPARISON:  None  Available. FINDINGS: Brain: Normal anatomic configuration. Parenchymal volume loss is commensurate with the patient's age. Mild periventricular white matter changes are present likely reflecting the sequela of small vessel ischemia. No abnormal intra or extra-axial mass lesion or fluid collection. No abnormal mass effect or midline shift. No evidence of acute intracranial hemorrhage or infarct. Ventricular size is normal. Cerebellum unremarkable. Vascular: No asymmetric hyperdense vasculature at the skull base. Skull: Intact Sinuses/Orbits: Paranasal sinuses are clear. Orbits are unremarkable. Other: Mastoid air cells and middle ear cavities are clear. IMPRESSION: No acute intracranial hemorrhage or infarct. Mild senescent change. Electronically Signed   By: Fidela Salisbury M.D.   On: 01/11/2022 23:26   DG Chest Portable 1 View  Result Date: 01/11/2022 CLINICAL DATA:  Cough. EXAM: PORTABLE CHEST 1 VIEW COMPARISON:  Abdominal series with chest x-ray 06/28/2008 FINDINGS: Heart is borderline enlarged. Mediastinal silhouette within normal limits. No focal lung infiltrate, pleural effusion or pneumothorax identified. Surgical clips overlie the upper abdomen. No acute fractures. IMPRESSION: No active disease. Electronically Signed   By: Tina Griffiths.D.  On: 01/11/2022 23:18   US PELVIS LIMITED (TRANSABDOMINAL ONLY)  Result Date: 12/29/2021 CLINICAL DATA:  Assess Foley placement. EXAM: LIMITED ULTRASOUND OF PELVIS TECHNIQUE: Limited transabdominal ultrasound examination of the pelvis was performed. COMPARISON:  CT abdomen pelvis dated December 01, 2021. FINDINGS: Foley catheter appears located within the inferior aspect of the decompressed bladder. There is extensive bladder wall irregularity and trabeculation as seen on the prior CT. Other findings:  None. IMPRESSION: 1. Foley catheter located within the inferior aspect of the decompressed bladder. 2. Sequelae of chronic bladder outlet obstruction. Electronically  Signed   By: Titus Dubin M.D.   On: 12/29/2021 11:29    Microbiology: Results for orders placed or performed during the hospital encounter of 01/11/22  Urine Culture     Status: Abnormal (Preliminary result)   Collection Time: 01/11/22  5:54 PM   Specimen: Urine, Clean Catch  Result Value Ref Range Status   Specimen Description URINE, CLEAN CATCH  Final   Special Requests NONE  Final   Culture (A)  Final    20,000 COLONIES/mL GRAM NEGATIVE RODS IDENTIFICATION AND SUSCEPTIBILITIES TO FOLLOW Performed at [facility], [place] [address] [place], [place] [address]    Report Status PENDING  Incomplete    Labs: CBC: Recent Labs  Lab 01/11/22 1756  WBC 9.4  NEUTROABS 5.6  HGB 9.4*  HCT 28.5*  MCV 84.3  PLT 354   Basic Metabolic Panel: Recent Labs  Lab 01/11/22 1756 01/12/22 0041 01/12/22 0448 01/12/22 0802  NA 122* 127* 128* 128*  K 3.8 4.1 3.3* 3.8  CL 94* 98 100 98  CO2 19* 18* 16* 19*  GLUCOSE 90 85 69* 81  BUN 5* 8 6* 5*  CREATININE 1.55* 1.50* 1.45* 1.50*  CALCIUM 8.4* 8.5* 8.6* 8.6*  MG  --   --   --  1.9   Liver Function Tests: Recent Labs  Lab 01/11/22 1756  AST 31  ALT 20  ALKPHOS 57  BILITOT 0.4  PROT 5.9*  ALBUMIN 3.3*   CBG: No results for input(s): "GLUCAP" in the last 168 hours.  Discharge time spent: greater than 30 minutes.  Signed: Mercy Riding, MD [facility] 01/12/2022

## 2022-01-12 NOTE — H&P (Addendum)
History and Physical   Ricky Young:[id number] DOB: 1941-07-02 DOA: 01/11/2022  PCP: Benito Mccreedy, MD  Patient coming from: Home  Chief Complaint: Abnormal Labs  HPI: Ricky Young is a 81 y.o. male with medical history significant of Anxiety, depression, HLD, Hypothyroidism, Prostate cancer, Neuropathy, Dementia, and CKD presenting with hyponatremia.   Patient went to PCP today and had labs drawn and was called with a sodium of 123 and told to come to the ED for further evaluation.  Patient has reportedly had intermittently low sodium in the upper 120s before but never this low.  This has been thought to be due to medication in the past which have been discontinued per reports.  Additionally family reports patient has had decreased p.o. intake for the last 2 to 3 weeks and has lost weight in the last couple of weeks as well.  Patient denies fevers, chills, chest pain, shortness of breath, Donnell pain, constipation, diarrhea, nausea, vomiting.  ED Course: Vital signs in the ED significant for heart rate in the 50s to 60s.  Lab blood pressure in the 456Y to 563S systolic.  Lab work-up included CMP with sodium 122, chloride 94, bicarb 19, creatinine stable 1.55, calcium 8.4, albumin 3.3, protein 5.9.  CBC with hemoglobin stable at 9.4.  TSH pending.  Serum osmolality 250.  Urine sodium 34.  TSH pending.  Urinalysis with hemoglobin trace leuks and rare bacteria.  Urine culture pending.  Chest x-ray without acute abnormality.  CT head without acute normality.  No worse as far in the ED.  Review of Systems: As per HPI otherwise all other systems reviewed and are negative.  Past Medical History:  Diagnosis Date   Anxiety    Arthritis    left knee   Depression    Hyperlipidemia    Hypothyroidism    Pre-diabetes    Prostate cancer ([place]) 2010   Wears dentures    Wears hearing aid in both ears     Past Surgical History:  Procedure Laterality Date   CYSTOSCOPY WITH RETROGRADE  URETHROGRAM N/A 12/27/2021   Procedure: CYSTOSCOPY WITH RETROGRADE URETHROGRAM AND BALLON DILATION;  Surgeon: Bjorn Loser, MD;  Location: [place];  Service: Urology;  Laterality: N/A;  45 MINS FOR CASE   FRACTURE SURGERY     yrs ago   KNEE SURGERY     yrs ago not sure if replacement done or not   PROSTATECTOMY  2010    Social History  reports that he has never smoked. His smokeless tobacco use includes chew. He reports that he does not currently use alcohol. He reports that he does not currently use drugs.  No Known Allergies  History reviewed. No pertinent family history.  Prior to Admission medications   Medication Sig Start Date End Date Taking? Authorizing Provider  cephALEXin (KEFLEX) 500 MG capsule Take 1 capsule (500 mg total) by mouth 3 (three) times daily. 12/29/21   Rancour, Annie Main, MD  levothyroxine (SYNTHROID) 25 MCG tablet Take 1 tablet by mouth at bedtime. 12/26/18   [provider]  meloxicam (MOBIC) 7.5 MG tablet Take 7.5 mg by mouth daily. 12/12/21   [provider]  mirtazapine (REMERON) 15 MG tablet Take 1 tablet by mouth daily. 12/26/18   [provider]  MYRBETRIQ 50 MG TB24 tablet Take 50 mg by mouth at bedtime. 03/23/21   [provider]  pravastatin (PRAVACHOL) 40 MG tablet Take 40 mg by mouth at bedtime.    [provider]    Physical Exam: Vitals:   01/11/22 1729 01/11/22 2026 01/11/22 2245 01/12/22 0015  BP: (!) 181/75 (!) 163/70 (!) 163/78 (!) 148/65  Pulse: 63 63 62 (!) 49  Resp: '14 18 19 17  '$ Temp: 99.6 F (37.6 C)     TempSrc: Oral     SpO2: 100% 100% 97% 100%    Physical Exam Constitutional:      General: He is not in acute distress.    Appearance: Normal appearance.  HENT:     Head: Normocephalic and atraumatic.     Mouth/Throat:     Mouth: Mucous membranes are moist.     Pharynx: Oropharynx is clear.  Eyes:     Extraocular Movements: Extraocular movements intact.      Pupils: Pupils are equal, round, and reactive to light.  Cardiovascular:     Rate and Rhythm: Normal rate and regular rhythm.     Pulses: Normal pulses.     Heart sounds: Normal heart sounds.  Pulmonary:     Effort: Pulmonary effort is normal. No respiratory distress.     Breath sounds: Normal breath sounds.  Abdominal:     General: Bowel sounds are normal. There is no distension.     Palpations: Abdomen is soft.     Tenderness: There is no abdominal tenderness.  Musculoskeletal:        General: No swelling or deformity.  Skin:    General: Skin is warm and dry.  Neurological:     General: No focal deficit present.     Mental Status: Mental status is at baseline.    Labs on Admission: I have personally reviewed following labs and imaging studies  CBC: Recent Labs  Lab 01/11/22 1756  WBC 9.4  NEUTROABS 5.6  HGB 9.4*  HCT 28.5*  MCV 84.3  PLT 626    Basic Metabolic Panel: Recent Labs  Lab 01/11/22 1756  NA 122*  K 3.8  CL 94*  CO2 19*  GLUCOSE 90  BUN 5*  CREATININE 1.55*  CALCIUM 8.4*    GFR: Estimated Creatinine Clearance: 29.6 mL/min (A) (by C-G formula based on SCr of 1.55 mg/dL (H)).  Liver Function Tests: Recent Labs  Lab 01/11/22 1756  AST 31  ALT 20  ALKPHOS 57  BILITOT 0.4  PROT 5.9*  ALBUMIN 3.3*    Urine analysis:    Component Value Date/Time   COLORURINE COLORLESS (A) 01/11/2022 2250   APPEARANCEUR CLEAR 01/11/2022 2250   LABSPEC 1.002 (L) 01/11/2022 2250   PHURINE 7.0 01/11/2022 2250   GLUCOSEU NEGATIVE 01/11/2022 2250   HGBUR MODERATE (A) 01/11/2022 2250   BILIRUBINUR NEGATIVE 01/11/2022 2250   KETONESUR NEGATIVE 01/11/2022 2250   PROTEINUR NEGATIVE 01/11/2022 2250   NITRITE NEGATIVE 01/11/2022 2250   LEUKOCYTESUR TRACE (A) 01/11/2022 2250    Radiological Exams on Admission: CT Head Wo Contrast  Result Date: 01/11/2022 CLINICAL DATA:  Mental status change, persistent or worsening EXAM: CT HEAD WITHOUT CONTRAST TECHNIQUE:  Contiguous axial images were obtained from the base of the skull through the vertex without intravenous contrast. RADIATION DOSE REDUCTION: This exam was performed according to the departmental dose-optimization program which includes automated exposure control, adjustment of the mA and/or kV according to patient size and/or use of iterative reconstruction technique. COMPARISON:  None Available. FINDINGS: Brain: Normal anatomic configuration. Parenchymal volume loss is commensurate with the patient's age. Mild periventricular white matter changes are present likely reflecting the sequela of small vessel ischemia. No abnormal intra or  extra-axial mass lesion or fluid collection. No abnormal mass effect or midline shift. No evidence of acute intracranial hemorrhage or infarct. Ventricular size is normal. Cerebellum unremarkable. Vascular: No asymmetric hyperdense vasculature at the skull base. Skull: Intact Sinuses/Orbits: Paranasal sinuses are clear. Orbits are unremarkable. Other: Mastoid air cells and middle ear cavities are clear. IMPRESSION: No acute intracranial hemorrhage or infarct. Mild senescent change. Electronically Signed   By: Fidela Salisbury M.D.   On: 01/11/2022 23:26   DG Chest Portable 1 View  Result Date: 01/11/2022 CLINICAL DATA:  Cough. EXAM: PORTABLE CHEST 1 VIEW COMPARISON:  Abdominal series with chest x-ray 06/28/2008 FINDINGS: Heart is borderline enlarged. Mediastinal silhouette within normal limits. No focal lung infiltrate, pleural effusion or pneumothorax identified. Surgical clips overlie the upper abdomen. No acute fractures. IMPRESSION: No active disease. Electronically Signed   By: Ronney Asters M.D.   On: 01/11/2022 23:18    EKG: Independently reviewed.  Sinus rhythm at 61 bpm.  Nonspecific intraventricular conduction delay with QRS 138.  Appears to be left bundle branch block morphology.  Similar to previous.  Assessment/Plan Principal Problem:   Hyponatremia Active  Problems:   History of prostate cancer   Hyperlipidemia   Hypothyroidism   Peripheral neuropathy   Current mild episode of major depressive disorder without prior episode ([place])   Hyponatremia > Patient presenting after abnormal labs at PCP office noted sodium of 123, confirmed to be 122 on admission. > Has had history of hyponatremia in the past but usually in the upper 120s never this low.  Thought to be due to medications in the past reportedly medication had been discontinued but not sure what. > Patient also been eating and drinking less last few weeks which could be contributing due to with decreased solute intake. > Patient family reporting some mild increase in confusion from baseline however he does have dementia and has had decreased p.o. intake as above.  Is alert and oriented. > Does still take Remeron that could contribute to hyponatremia we will hold this. > In ED serum osmolality 250, urine sodium 34. - Monitor on progressive unit - Initiate normal saline at 100 cc an hour - Trend BMP every 4 hours - Check urine osmolality - Hold home Remeron - Follow-up TSH  Hypothyroidism - Continue home Synthroid  Hyperlipidemia - Continue home pravastatin  CKD 3a > Creatinine stable at 1.55 - Continue IV fluids as above - Trend renal function and electrolytes  History of prostate cancer Indwelling Foley > Patient with known history of prostate cancer currently with indwelling Foley.  Patient follows with urology.  Was due to have Foley removed tomorrow. - Continue to monitor - Consider Urology eval  DVT prophylaxis: Lovenox Code Status:   Full Family Communication:  Updated at bedside.  Disposition Plan:   Patient is from:  Home  Anticipated DC to:  Home  Anticipated DC date:  1 to 3 days  Anticipated DC barriers: None  Consults called:  None Admission status:  Observation, progressive  Severity of Illness: The appropriate patient status for this patient is  OBSERVATION. Observation status is judged to be reasonable and necessary in order to provide the required intensity of service to ensure the patient's safety. The patient's presenting symptoms, physical exam findings, and initial radiographic and laboratory data in the context of their medical condition is felt to place them at decreased risk for further clinical deterioration. Furthermore, it is anticipated that the patient will be medically stable for discharge from the  hospital within 2 midnights of admission.    Marcelyn Bruins MD [facility]  How to contact the [facility] Attending or Consulting provider [place] or covering provider during after hours [place], for this patient?   Check the care team in [facility] and look for a) attending/consulting [facility] provider listed and b) the [facility] team listed Log into [email or web address] and use 's universal password to access. If you do not have the password, please contact the hospital operator. Locate the [facility] provider you are looking for under [facility] and page to a number that you can be directly reached. If you still have difficulty reaching the provider, please page the [facility] (Director on Call) for the Hospitalists listed on amion for assistance.  01/12/2022, 12:47 AM

## 2022-01-13 LAB — URINE CULTURE: Culture: 20000 — AB

## 2022-01-14 ENCOUNTER — Telehealth: Payer: Self-pay | Admitting: *Deleted

## 2022-01-14 NOTE — Telephone Encounter (Signed)
Post ED Visit - Positive Culture Follow-up  Culture report reviewed by antimicrobial stewardship pharmacist: [place] Team '[]'$  Elenor Quinones, Pharm.D. '[]'$  Heide Guile, Pharm.D., BCPS AQ-ID '[]'$  Parks Neptune, Pharm.D., BCPS '[]'$  Alycia Rossetti, Pharm.D., BCPS '[]'$  [place], Pharm.D., BCPS, AAHIVP '[]'$  Legrand Como, Pharm.D., BCPS, AAHIVP '[]'$  Salome Arnt, PharmD, BCPS '[]'$  Johnnette Gourd, PharmD, BCPS '[]'$  Hughes Better, PharmD, BCPS '[]'$  Leeroy Cha, PharmD '[]'$  Laqueta Linden, PharmD, BCPS '[]'$  Albertina Parr, PharmD  [place] Team '[]'$  Leodis Sias, PharmD '[]'$  Lindell Spar, PharmD '[]'$  Royetta Asal, PharmD '[]'$  Graylin Shiver, Rph '[]'$  Rema Fendt) Glennon Mac, PharmD '[]'$  Arlyn Dunning, PharmD '[]'$  Netta Cedars, PharmD '[]'$  Dia Sitter, PharmD '[]'$  Leone Haven, PharmD '[]'$  Gretta Arab, PharmD '[]'$  Theodis Shove, PharmD '[]'$  Peggyann Juba, PharmD '[]'$  Reuel Boom, PharmD   Positive urine culture Follow up with PCP, and no further patient follow-up is required at this time.  Isla Pence, MD  [place] 01/14/2022, 1:29 PM

## 2022-01-14 NOTE — Progress Notes (Signed)
ED Antimicrobial Stewardship Positive Culture Follow Up   Ricky Young is an 81 y.o. male who presented to [facility] on 01/11/2022 with a chief complaint of  Chief Complaint  Patient presents with   Abnormal Lab    Recent Results (from the past 720 hour(s))  Urine Culture     Status: None   Collection Time: 12/29/21 10:12 AM   Specimen: Urine, Clean Catch  Result Value Ref Range Status   Specimen Description   Final    URINE, CLEAN CATCH Performed at [facility], [place] [address] [place], [place] [address]    Special Requests   Final    NONE Performed at [facility], [place] [address] [place], [place] [address]    Culture   Final    NO GROWTH Performed at [facility], [place] [address] [place], [place] [address]    Report Status 12/30/2021 FINAL  Final  Urine Culture     Status: Abnormal   Collection Time: 01/11/22  5:54 PM   Specimen: Urine, Clean Catch  Result Value Ref Range Status   Specimen Description URINE, CLEAN CATCH  Final   Special Requests   Final    NONE Performed at [facility], [place] [address] [place], [place] [address]    Culture 20,000 COLONIES/mL STENOTROPHOMONAS MALTOPHILIA (A)  Final   Report Status 01/13/2022 FINAL  Final   Organism ID, Bacteria STENOTROPHOMONAS MALTOPHILIA (A)  Final      Susceptibility   Stenotrophomonas maltophilia - MIC*    LEVOFLOXACIN 2 SENSITIVE Sensitive     TRIMETH/SULFA <=20 SENSITIVE Sensitive     * 20,000 COLONIES/mL STENOTROPHOMONAS MALTOPHILIA    '[x]'$  Patient discharged by hospitalist. MD Cyndia Skeeters knew about 20K GNR at time of discharge. Culture from foley and with low suspicion for UTI. Recommend f/u with PCP.  ED Provider: Christel Mormon, PharmD, BCPS 01/14/2022 11:18 AM ED Clinical Pharmacist -  [phone]

## 2022-10-06 IMAGING — CR DG FOOT 2V*L*
2 series · 2 of 2 positions shown · non-contrast
Comparison: 02/15/2021

CLINICAL DATA: Sprain, stepped wrong 3 weeks ago, medial dorsal
proximal foot pain

EXAM:
LEFT FOOT - 2 VIEW

[t foot ap left]
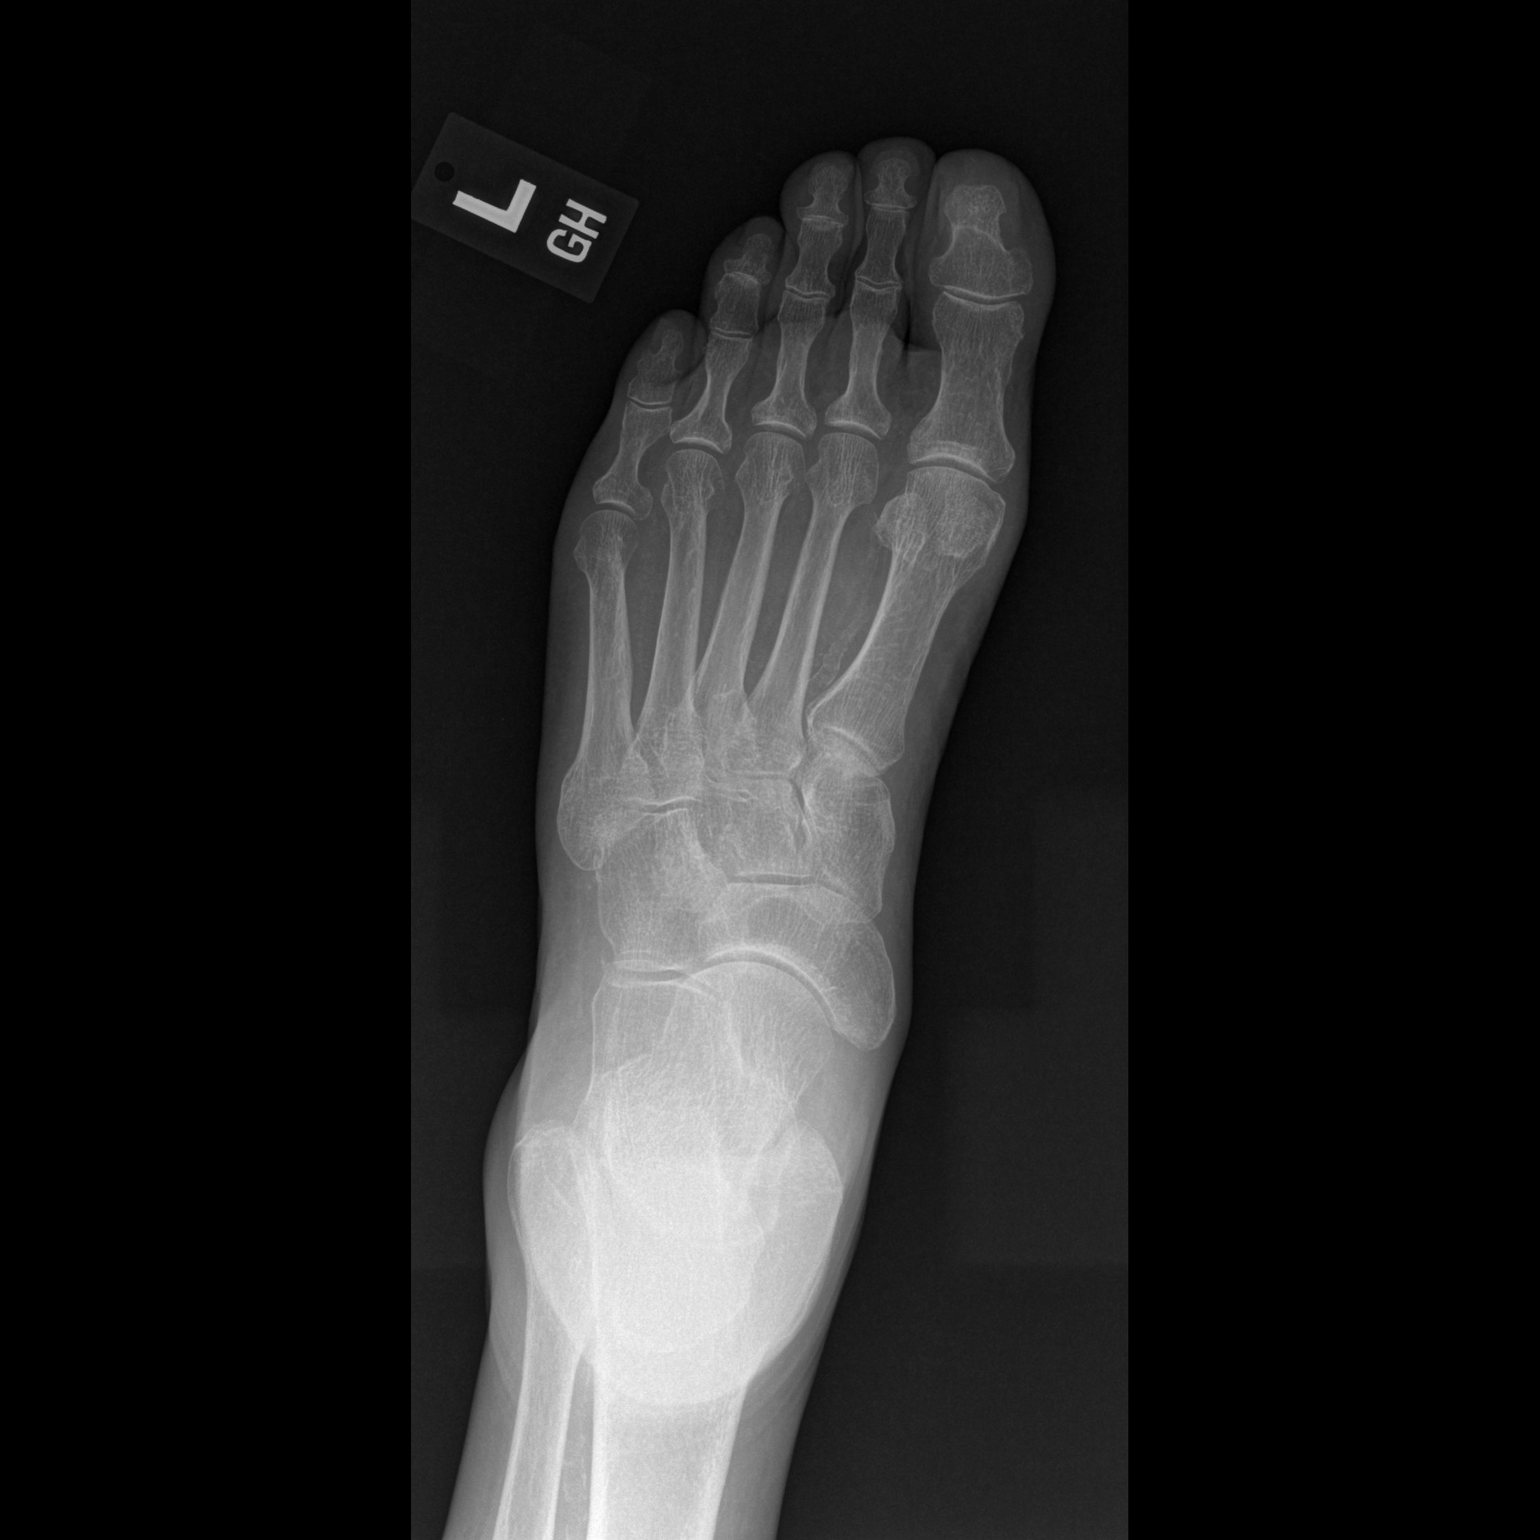

[t foot lat left]
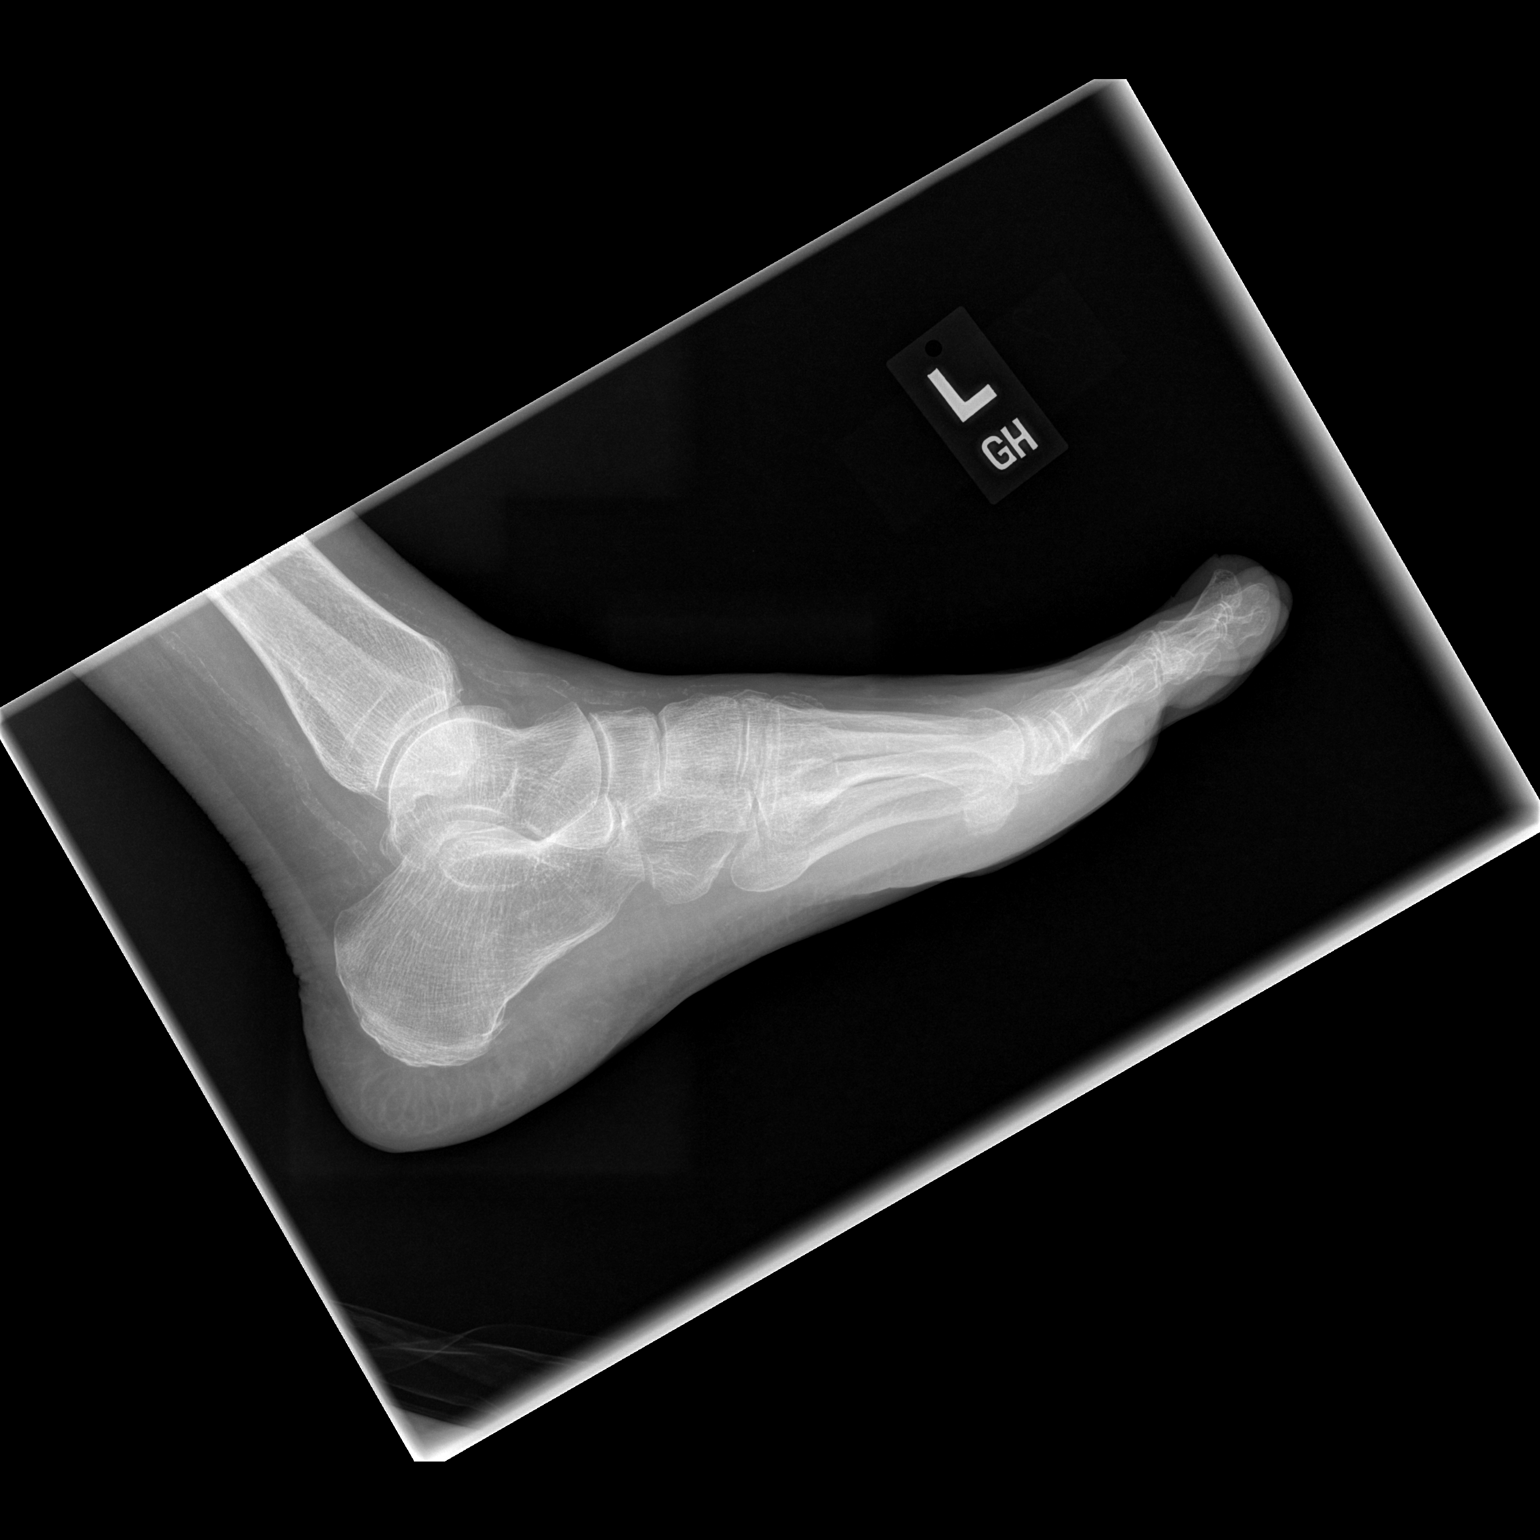

[2 of 2 positions shown; findings below may reference images not displayed]

FINDINGS: Osseous demineralization.

Joint spaces preserved.

No acute fracture, dislocation, or bone destruction.

Atherosclerotic calcifications LEFT foot and ankle.
IMPRESSION: No acute osseous abnormalities.
